# Patient Record
Sex: Male | Born: 1935
Health system: Southern US, Community
[De-identification: ages and names within clinical notes are randomized; demographics above are authoritative.]

## PROBLEM LIST (undated history)

## (undated) DIAGNOSIS — I6529 Occlusion and stenosis of unspecified carotid artery: Secondary | ICD-10-CM

## (undated) DIAGNOSIS — I4430 Unspecified atrioventricular block: Secondary | ICD-10-CM

## (undated) DIAGNOSIS — J439 Emphysema, unspecified: Secondary | ICD-10-CM

## (undated) DIAGNOSIS — L409 Psoriasis, unspecified: Secondary | ICD-10-CM

## (undated) DIAGNOSIS — C4490 Unspecified malignant neoplasm of skin, unspecified: Secondary | ICD-10-CM

## (undated) DIAGNOSIS — E785 Hyperlipidemia, unspecified: Secondary | ICD-10-CM

## (undated) DIAGNOSIS — K635 Polyp of colon: Secondary | ICD-10-CM

## (undated) DIAGNOSIS — I639 Cerebral infarction, unspecified: Secondary | ICD-10-CM

## (undated) DIAGNOSIS — I10 Essential (primary) hypertension: Secondary | ICD-10-CM

## (undated) HISTORY — DX: Emphysema, unspecified: J43.9

## (undated) HISTORY — DX: Psoriasis, unspecified: L40.9

## (undated) HISTORY — DX: Unspecified atrioventricular block: I44.30

## (undated) HISTORY — DX: Unspecified malignant neoplasm of skin, unspecified: C44.90

## (undated) HISTORY — PX: SKIN CANCER EXCISION: SHX779

## (undated) HISTORY — DX: Polyp of colon: K63.5

## (undated) HISTORY — PX: TONSILLECTOMY: SUR1361

## (undated) HISTORY — DX: Hyperlipidemia, unspecified: E78.5

## (undated) HISTORY — PX: FRACTURE SURGERY: SHX138

---

## 2004-08-04 ENCOUNTER — Ambulatory Visit (HOSPITAL_COMMUNITY): Admission: RE | Admit: 2004-08-04 | Discharge: 2004-08-04 | Payer: Self-pay | Admitting: Internal Medicine

## 2004-09-28 ENCOUNTER — Encounter: Admission: RE | Admit: 2004-09-28 | Discharge: 2004-09-28 | Payer: Self-pay | Admitting: Internal Medicine

## 2007-02-13 ENCOUNTER — Ambulatory Visit (HOSPITAL_COMMUNITY): Admission: RE | Admit: 2007-02-13 | Discharge: 2007-02-13 | Payer: Self-pay | Admitting: Internal Medicine

## 2009-02-03 ENCOUNTER — Ambulatory Visit (HOSPITAL_COMMUNITY): Admission: RE | Admit: 2009-02-03 | Discharge: 2009-02-03 | Payer: Self-pay | Admitting: Internal Medicine

## 2009-02-03 ENCOUNTER — Ambulatory Visit: Payer: Self-pay | Admitting: Vascular Surgery

## 2009-02-03 ENCOUNTER — Encounter (INDEPENDENT_AMBULATORY_CARE_PROVIDER_SITE_OTHER): Payer: Self-pay | Admitting: Internal Medicine

## 2009-02-11 ENCOUNTER — Ambulatory Visit (HOSPITAL_COMMUNITY): Admission: RE | Admit: 2009-02-11 | Discharge: 2009-02-11 | Payer: Self-pay | Admitting: Internal Medicine

## 2009-02-11 ENCOUNTER — Encounter (INDEPENDENT_AMBULATORY_CARE_PROVIDER_SITE_OTHER): Payer: Self-pay | Admitting: Internal Medicine

## 2011-07-23 ENCOUNTER — Ambulatory Visit (INDEPENDENT_AMBULATORY_CARE_PROVIDER_SITE_OTHER): Payer: Medicare Other | Admitting: Family Medicine

## 2011-07-23 VITALS — BP 148/68 | HR 61 | Temp 98.2°F | Resp 18 | Ht 71.18 in | Wt 196.4 lb

## 2011-07-23 DIAGNOSIS — S90859A Superficial foreign body, unspecified foot, initial encounter: Secondary | ICD-10-CM

## 2011-07-23 DIAGNOSIS — IMO0002 Reserved for concepts with insufficient information to code with codable children: Secondary | ICD-10-CM

## 2011-07-23 DIAGNOSIS — M79673 Pain in unspecified foot: Secondary | ICD-10-CM

## 2011-07-23 DIAGNOSIS — M79609 Pain in unspecified limb: Secondary | ICD-10-CM

## 2011-07-23 NOTE — Progress Notes (Signed)
VCO.  Local anesthesia with 1 cc 2% lidocaine plain.  SP.  3 mm punch over the entrance wound.  No FB found. Cleansed.  Pressure dressing applied.

## 2011-07-23 NOTE — Progress Notes (Signed)
  Subjective:    Patient ID: Doyce Para, male    DOB: 08-15-1931, 78 y.o.   MRN: [id number]  HPI 78 yo male here with splinter in his foot. Walking barefoot on deck and got splinter in left heel.  Wife tried to get it out.  Got some out but still saw some deeper.     Review of Systems Negative except as per HPI     Objective:   Physical Exam  Constitutional: He appears well-developed.  Pulmonary/Chest: Effort normal.  Neurological: He is alert.  Skin:       Left heel with small open shallow wound with question of dark FB visible versus blood/vessel          Assessment & Plan:  Foot pain Foreign body in foot Removed per PA note.

## 2011-08-02 ENCOUNTER — Other Ambulatory Visit: Payer: Self-pay | Admitting: Internal Medicine

## 2011-08-02 ENCOUNTER — Ambulatory Visit
Admission: RE | Admit: 2011-08-02 | Discharge: 2011-08-02 | Disposition: A | Discharge status: Home / Self Care | Payer: Self-pay | Source: Ambulatory Visit | Attending: Internal Medicine | Admitting: Internal Medicine

## 2011-08-02 DIAGNOSIS — T148XXA Other injury of unspecified body region, initial encounter: Secondary | ICD-10-CM

## 2012-02-07 ENCOUNTER — Other Ambulatory Visit (HOSPITAL_COMMUNITY): Payer: Self-pay | Admitting: Internal Medicine

## 2012-02-07 DIAGNOSIS — R0989 Other specified symptoms and signs involving the circulatory and respiratory systems: Secondary | ICD-10-CM

## 2012-02-14 ENCOUNTER — Ambulatory Visit (HOSPITAL_COMMUNITY)
Admission: RE | Admit: 2012-02-14 | Discharge: 2012-02-14 | Disposition: A | Discharge status: Home / Self Care | Payer: Medicare Other | Source: Ambulatory Visit | Attending: Internal Medicine | Admitting: Internal Medicine

## 2012-02-14 DIAGNOSIS — R0989 Other specified symptoms and signs involving the circulatory and respiratory systems: Secondary | ICD-10-CM | POA: Insufficient documentation

## 2012-02-14 NOTE — Progress Notes (Signed)
Right:  No evidence of significant ICA stenosis.  Vertebral artery flow is retrograde.  Left:  60-79% internal carotid artery stenosis.  Vertebral artery flow is antegrade.

## 2013-02-12 ENCOUNTER — Other Ambulatory Visit (HOSPITAL_COMMUNITY): Payer: Self-pay | Admitting: Internal Medicine

## 2013-02-12 DIAGNOSIS — I6529 Occlusion and stenosis of unspecified carotid artery: Secondary | ICD-10-CM

## 2013-02-17 ENCOUNTER — Other Ambulatory Visit: Payer: Self-pay | Admitting: Internal Medicine

## 2013-02-17 ENCOUNTER — Ambulatory Visit
Admission: RE | Admit: 2013-02-17 | Discharge: 2013-02-17 | Disposition: A | Discharge status: Home / Self Care | Payer: Medicare Other | Source: Ambulatory Visit | Attending: Internal Medicine | Admitting: Internal Medicine

## 2013-02-17 DIAGNOSIS — M25572 Pain in left ankle and joints of left foot: Secondary | ICD-10-CM

## 2013-02-17 DIAGNOSIS — M79671 Pain in right foot: Secondary | ICD-10-CM

## 2013-02-17 DIAGNOSIS — M25571 Pain in right ankle and joints of right foot: Secondary | ICD-10-CM

## 2013-02-17 DIAGNOSIS — M79672 Pain in left foot: Secondary | ICD-10-CM

## 2013-03-20 ENCOUNTER — Ambulatory Visit (HOSPITAL_COMMUNITY)
Admission: RE | Admit: 2013-03-20 | Discharge: 2013-03-20 | Disposition: A | Discharge status: Home / Self Care | Payer: Medicare Other | Source: Ambulatory Visit | Attending: Internal Medicine | Admitting: Internal Medicine

## 2013-03-20 DIAGNOSIS — R0989 Other specified symptoms and signs involving the circulatory and respiratory systems: Secondary | ICD-10-CM

## 2013-03-20 DIAGNOSIS — I6529 Occlusion and stenosis of unspecified carotid artery: Secondary | ICD-10-CM | POA: Insufficient documentation

## 2013-03-20 NOTE — Progress Notes (Signed)
VASCULAR LAB PRELIMINARY  PRELIMINARY  PRELIMINARY  PRELIMINARY  Carotid duplex completed.    Preliminary report: Moderate to severe calcific plaque noted bilaterally.  No significant right ICA stenosis.  Lt ICA stenosis of 60 to 79%  Right vertebral artery flow is retrograde.  Left vertebral artery flow antegrade.    Shyne Lehrke, RVT 03/20/2013, 3:47 PM

## 2014-04-15 ENCOUNTER — Ambulatory Visit (HOSPITAL_COMMUNITY): Payer: Medicare Other

## 2014-04-15 ENCOUNTER — Ambulatory Visit (HOSPITAL_COMMUNITY)
Admission: RE | Admit: 2014-04-15 | Discharge: 2014-04-15 | Disposition: A | Discharge status: Home / Self Care | Payer: Medicare Other | Source: Ambulatory Visit | Attending: Internal Medicine | Admitting: Internal Medicine

## 2014-04-15 ENCOUNTER — Other Ambulatory Visit (HOSPITAL_COMMUNITY): Payer: Self-pay | Admitting: Internal Medicine

## 2014-04-15 DIAGNOSIS — R42 Dizziness and giddiness: Secondary | ICD-10-CM

## 2014-04-15 NOTE — Progress Notes (Signed)
VASCULAR LAB PRELIMINARY  PRELIMINARY  PRELIMINARY  PRELIMINARY  Carotid duplex completed.    Preliminary report:  Leftt - 60% to 79% Upper end of scale. Acoustic shadowing may obscure higher velocities Vertebral artery flow is antegrade. Right - 1% to 39% ICA stenosis upper end of scale vertebral artery flow is retrograde. No significant change since study of 2015.  Roselyne Stalnaker, RVS 04/15/2014, 2:55 PM

## 2019-01-07 ENCOUNTER — Other Ambulatory Visit: Payer: Self-pay

## 2019-01-07 ENCOUNTER — Ambulatory Visit (INDEPENDENT_AMBULATORY_CARE_PROVIDER_SITE_OTHER): Payer: Medicare Other | Admitting: Otolaryngology

## 2019-01-07 ENCOUNTER — Encounter (INDEPENDENT_AMBULATORY_CARE_PROVIDER_SITE_OTHER): Payer: Self-pay | Admitting: Otolaryngology

## 2019-01-07 VITALS — Temp 97.7°F

## 2019-01-07 DIAGNOSIS — H9120 Sudden idiopathic hearing loss, unspecified ear: Secondary | ICD-10-CM

## 2019-01-07 NOTE — Progress Notes (Signed)
HPI: Roger Benjamin is a 85 y.o. male who returns today for evaluation of left ear sudden sensorineural hearing loss.  He completed the steroid Dosepak.  Repeat audiogram today demonstrated no change in his hearing with severe left ear SNHL.  He does not complain of any further balance problems or headaches..  No past medical history on file.  Social History   Socioeconomic History  . Marital status: Married    Spouse name: Not on file  . Number of children: Not on file  . Years of education: Not on file  . Highest education level: Not on file  Occupational History  . Not on file  Social Needs  . Financial resource strain: Not on file  . Food insecurity    Worry: Not on file    Inability: Not on file  . Transportation needs    Medical: Not on file    Non-medical: Not on file  Tobacco Use  . Smoking status: Former Smoker    Packs/day: 3.00    Years: 30.00    Pack years: 90.00    Types: Cigarettes    Start date: 58    Quit date: 02/27/1980    Years since quitting: 38.8  . Smokeless tobacco: Never Used  Substance and Sexual Activity  . Alcohol use: Not on file  . Drug use: Not on file  . Sexual activity: Not on file  Lifestyle  . Physical activity    Days per week: Not on file    Minutes per session: Not on file  . Stress: Not on file  Relationships  . Social Herbalist on phone: Not on file    Gets together: Not on file    Attends religious service: Not on file    Active member of club or organization: Not on file    Attends meetings of clubs or organizations: Not on file    Relationship status: Not on file  Other Topics Concern  . Not on file  Social History Narrative  . Not on file   No family history on file. No Known Allergies Prior to Admission medications   Medication Sig Start Date End Date Taking? Authorizing Provider  metoprolol succinate (TOPROL-XL) 25 MG 24 hr tablet Take 25 mg by mouth daily.    [provider]  Rosuvastatin  Calcium (CRESTOR PO) Take 1 tablet by mouth daily.    [provider]     Positive ROS: Are otherwise negative  All other systems have been reviewed and were otherwise negative with the exception of those mentioned in the HPI and as above.  Physical Exam: General: Alert, no acute distress Ears: Ear canals are clear bilaterally with intact, clear TMs Nasal: Clear nasal passages Oral: Clear oropharynx Neck: No palpable adenopathy or masses  Procedures  Assessment: Left ear sudden sensorineural hearing loss  Plan: Reviewed the present audiogram with the patient.  He has been adapting to only hearing in his right ear. Discussed options of hearing device with audiology versus doing nothing.  He has not really interested in pursuing a hearing device. Discussed with him concerning contacting us if he has any problems with hearing in the right ear.  Did not recommend any further treatment.   Radene Journey, MD

## 2019-01-09 ENCOUNTER — Encounter (INDEPENDENT_AMBULATORY_CARE_PROVIDER_SITE_OTHER): Payer: Self-pay

## 2019-08-02 ENCOUNTER — Emergency Department (HOSPITAL_COMMUNITY): Payer: Medicare Other

## 2019-08-02 ENCOUNTER — Emergency Department (HOSPITAL_COMMUNITY)
Admission: EM | Admit: 2019-08-02 | Discharge: 2019-08-03 | Disposition: A | Discharge status: Home / Self Care | Payer: Medicare Other | Attending: Emergency Medicine | Admitting: Emergency Medicine

## 2019-08-02 ENCOUNTER — Other Ambulatory Visit: Payer: Self-pay

## 2019-08-02 ENCOUNTER — Encounter (HOSPITAL_COMMUNITY): Payer: Self-pay | Admitting: Emergency Medicine

## 2019-08-02 DIAGNOSIS — Y999 Unspecified external cause status: Secondary | ICD-10-CM | POA: Insufficient documentation

## 2019-08-02 DIAGNOSIS — Y92410 Unspecified street and highway as the place of occurrence of the external cause: Secondary | ICD-10-CM | POA: Diagnosis not present

## 2019-08-02 DIAGNOSIS — Z87891 Personal history of nicotine dependence: Secondary | ICD-10-CM | POA: Diagnosis not present

## 2019-08-02 DIAGNOSIS — Y9389 Activity, other specified: Secondary | ICD-10-CM | POA: Insufficient documentation

## 2019-08-02 DIAGNOSIS — S51012A Laceration without foreign body of left elbow, initial encounter: Secondary | ICD-10-CM | POA: Diagnosis not present

## 2019-08-02 DIAGNOSIS — R0781 Pleurodynia: Secondary | ICD-10-CM | POA: Diagnosis not present

## 2019-08-02 DIAGNOSIS — Z79899 Other long term (current) drug therapy: Secondary | ICD-10-CM | POA: Insufficient documentation

## 2019-08-02 DIAGNOSIS — S59902A Unspecified injury of left elbow, initial encounter: Secondary | ICD-10-CM | POA: Diagnosis present

## 2019-08-02 LAB — CBC WITH DIFFERENTIAL/PLATELET
Abs Immature Granulocytes: 0.09 10*3/uL — ABNORMAL HIGH (ref 0.00–0.07)
Basophils Absolute: 0.1 10*3/uL (ref 0.0–0.1)
Basophils Relative: 0 %
Eosinophils Absolute: 0.4 10*3/uL (ref 0.0–0.5)
Eosinophils Relative: 3 %
HCT: 48.6 % (ref 39.0–52.0)
Hemoglobin: 15.9 g/dL (ref 13.0–17.0)
Immature Granulocytes: 1 %
Lymphocytes Relative: 10 %
Lymphs Abs: 1.5 10*3/uL (ref 0.7–4.0)
MCH: 31.5 pg (ref 26.0–34.0)
MCHC: 32.7 g/dL (ref 30.0–36.0)
MCV: 96.2 fL (ref 80.0–100.0)
Monocytes Absolute: 1.6 10*3/uL — ABNORMAL HIGH (ref 0.1–1.0)
Monocytes Relative: 11 %
Neutro Abs: 10.8 10*3/uL — ABNORMAL HIGH (ref 1.7–7.7)
Neutrophils Relative %: 75 %
Platelets: 236 10*3/uL (ref 150–400)
RBC: 5.05 MIL/uL (ref 4.22–5.81)
RDW: 12.5 % (ref 11.5–15.5)
WBC: 14.4 10*3/uL — ABNORMAL HIGH (ref 4.0–10.5)
nRBC: 0 % (ref 0.0–0.2)

## 2019-08-02 LAB — BASIC METABOLIC PANEL
Anion gap: 11 (ref 5–15)
BUN: 25 mg/dL — ABNORMAL HIGH (ref 8–23)
CO2: 26 mmol/L (ref 22–32)
Calcium: 9.6 mg/dL (ref 8.9–10.3)
Chloride: 103 mmol/L (ref 98–111)
Creatinine, Ser: 0.99 mg/dL (ref 0.61–1.24)
GFR calc Af Amer: >60 mL/min (ref >60)
GFR calc non Af Amer: >60 mL/min (ref >60)
Glucose, Bld: 102 mg/dL — ABNORMAL HIGH (ref 70–99)
Potassium: 4.4 mmol/L (ref 3.5–5.1)
Sodium: 140 mmol/L (ref 135–145)
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
## 2019-09-01 ENCOUNTER — Other Ambulatory Visit: Payer: Self-pay

## 2019-09-01 ENCOUNTER — Other Ambulatory Visit (HOSPITAL_COMMUNITY): Payer: Self-pay | Admitting: Internal Medicine

## 2019-09-01 ENCOUNTER — Ambulatory Visit (HOSPITAL_COMMUNITY)
Admission: RE | Admit: 2019-09-01 | Discharge: 2019-09-01 | Disposition: A | Discharge status: Home / Self Care | Payer: Medicare Other | Source: Ambulatory Visit | Attending: Surgery | Admitting: Surgery

## 2019-09-01 DIAGNOSIS — I6522 Occlusion and stenosis of left carotid artery: Secondary | ICD-10-CM | POA: Diagnosis not present

## 2019-09-01 DIAGNOSIS — I440 Atrioventricular block, first degree: Secondary | ICD-10-CM

## 2019-09-07 ENCOUNTER — Other Ambulatory Visit: Payer: Self-pay

## 2019-09-07 ENCOUNTER — Ambulatory Visit (INDEPENDENT_AMBULATORY_CARE_PROVIDER_SITE_OTHER): Payer: Medicare Other | Admitting: Neurology

## 2019-09-07 ENCOUNTER — Encounter: Payer: Self-pay | Admitting: Neurology

## 2019-09-07 VITALS — BP 140/74 | HR 60 | Ht 73.0 in | Wt 185.0 lb

## 2019-09-07 DIAGNOSIS — G4719 Other hypersomnia: Secondary | ICD-10-CM | POA: Diagnosis not present

## 2019-09-07 DIAGNOSIS — R351 Nocturia: Secondary | ICD-10-CM

## 2019-09-07 DIAGNOSIS — G478 Other sleep disorders: Secondary | ICD-10-CM

## 2019-09-07 NOTE — Patient Instructions (Addendum)
Thank you for choosing [facility] for your sleep related care! It was nice to meet you today! I appreciate that you entrust me with your sleep related healthcare concerns. I hope, I was able to address at least some of your concerns today, and that I can help you feel reassured and also get better.    Here is what we discussed today and what we came up with as our plan for you:    Based on your symptoms and your exam I believe you are at risk for obstructive sleep apnea (aka OSA), and I think we should proceed with a sleep study to determine whether you do or do not have OSA and how severe it is. Even, if you have mild OSA, I may want you to consider treatment with CPAP, as treatment of even borderline or mild sleep apnea can result and improvement of symptoms such as sleep disruption, daytime sleepiness, nighttime bathroom breaks, restless leg symptoms, improvement of headache syndromes, even improved mood disorder.   I understand that you prefer to do a home sleep test.  Please remember, the long-term risks and ramifications of untreated moderate to severe obstructive sleep apnea are: increased Cardiovascular disease, including congestive heart failure, stroke, difficult to control hypertension, treatment resistant obesity, arrhythmias, especially irregular heartbeat commonly known as A. Fib. (atrial fibrillation); even type 2 diabetes has been linked to untreated OSA.   Sleep apnea can cause disruption of sleep and sleep deprivation in most cases, which, in turn, can cause recurrent headaches, problems with memory, mood, concentration, focus, and vigilance. Most people with untreated sleep apnea report excessive daytime sleepiness, which can affect their ability to drive. Please do not drive if you feel sleepy. Patients with sleep apnea developed difficulty initiating and maintaining sleep (aka insomnia).   Having sleep apnea may increase your risk for other sleep disorders,  including involuntary behaviors sleep such as sleep terrors, sleep talking, sleepwalking.    Having sleep apnea can also increase your risk for restless leg syndrome and leg movements at night.   Please note that untreated obstructive sleep apnea may carry additional perioperative morbidity. Patients with significant obstructive sleep apnea (typically, in the moderate to severe degree) should receive, if possible, perioperative PAP (positive airway pressure) therapy and the surgeons and particularly the anesthesiologists should be informed of the diagnosis and the severity of the sleep disordered breathing.   I will likely see you back after your sleep study to go over the test results and where to go from there. We will call you after your sleep study to advise about the results (most likely, you will hear from [place], my nurse) and to set up an appointment at the time, as necessary.    Our sleep lab administrative assistant will call you to schedule your sleep study and give you further instructions, regarding the check in process for the sleep study, arrival time, what to bring, when you can expect to leave after the study, etc., and to answer any other logistical questions you may have. If you don't hear back from her by about 2 weeks from now, please feel free to call her direct line at [phone] or you can call our general clinic number, or email [place] through My Chart.
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
## 2019-09-08 ENCOUNTER — Encounter: Payer: Self-pay | Admitting: Vascular Surgery

## 2019-09-08 ENCOUNTER — Other Ambulatory Visit: Payer: Self-pay

## 2019-09-08 ENCOUNTER — Ambulatory Visit (INDEPENDENT_AMBULATORY_CARE_PROVIDER_SITE_OTHER): Payer: Medicare Other | Admitting: Vascular Surgery

## 2019-09-08 VITALS — BP 149/84 | HR 59 | Temp 98.3°F | Resp 20 | Ht 73.0 in | Wt 186.4 lb

## 2019-09-08 DIAGNOSIS — I6522 Occlusion and stenosis of left carotid artery: Secondary | ICD-10-CM

## 2019-09-08 DIAGNOSIS — I6529 Occlusion and stenosis of unspecified carotid artery: Secondary | ICD-10-CM

## 2019-09-08 NOTE — Progress Notes (Signed)
[facility] [place]  Patient name: Roger Benjamin MRN: [id number] DOB: Mar 02, 1931 Sex: male  REASON FOR CONSULT: Evaluate left carotid stenosis  HPI: Roger Benjamin is a 86 y.o. male, who is here today for evaluation of left carotid stenosis.  He reports that he has had a known history of asymptomatic carotid disease does been followed for as much as 25 years.  He recently was undergoing resurveillance for excessive somnolence.  Part of his work-up included carotid duplex evaluation.  He is seen today for further discussion of this.  He is quite active at his age of 58.  He still works 5 days a week at his [employer].  He specifically denies any prior history of amaurosis fugax, aphasia, transient ischemic attack or stroke.  Past Medical History:  Diagnosis Date  . CAD (coronary artery disease)   . Skin cancer     History reviewed. No pertinent family history.  SOCIAL HISTORY: Social History   Socioeconomic History  . Marital status: Married    Spouse name: Not on file  . Number of children: Not on file  . Years of education: Not on file  . Highest education level: Not on file  Occupational History  . Not on file  Tobacco Use  . Smoking status: Former Smoker    Packs/day: 3.00    Years: 30.00    Pack years: 90.00    Types: Cigarettes    Start date: 83    Quit date: 02/27/1980    Years since quitting: 39.5  . Smokeless tobacco: Never Used  Vaping Use  . Vaping Use: Never used  Substance and Sexual Activity  . Alcohol use: Yes    Comment: 1 drink dail   . Drug use: Never  . Sexual activity: Not on file  Other Topics Concern  . Not on file  Social History Narrative  . Not on file   Social Determinants of Health   Financial Resource Strain:   . Difficulty of Paying Living Expenses:   Food Insecurity:   . Worried About Charity fundraiser in the Last Year:   . Arboriculturist in the Last Year:    Transportation Needs:   . Film/video editor (Medical):   Marland Kitchen Lack of Transportation (Non-Medical):   Physical Activity:   . Days of Exercise per Week:   . Minutes of Exercise per Session:   Stress:   . Feeling of Stress :   Social Connections:   . Frequency of Communication with Friends and Family:   . Frequency of Social Gatherings with Friends and Family:   . Attends Religious Services:   . Active Member of Clubs or Organizations:   . Attends Archivist Meetings:   Marland Kitchen Marital Status:   Intimate Partner Violence:   . Fear of Current or Ex-Partner:   . Emotionally Abused:   Marland Kitchen Physically Abused:   . Sexually Abused:     No Known Allergies  Current Outpatient Medications  Medication Sig Dispense Refill  . metoprolol succinate (TOPROL-XL) 25 MG 24 hr tablet Take 25 mg by mouth daily.     No current facility-administered medications for this visit.    REVIEW OF SYSTEMS:  [X]  denotes positive finding, [ ]  denotes negative finding Cardiac  Comments:  Chest pain or chest pressure:    Shortness of breath upon exertion:    Short of breath when lying flat:    Irregular heart rhythm:  Vascular    Pain in calf, thigh, or hip brought on by ambulation:    Pain in feet at night that wakes you up from your sleep:     Blood clot in your veins:    Leg swelling:         Pulmonary    Oxygen at home:    Productive cough:     Wheezing:         Neurologic    Sudden weakness in arms or legs:     Sudden numbness in arms or legs:     Sudden onset of difficulty speaking or slurred speech:    Temporary loss of vision in one eye:     Problems with dizziness:         Gastrointestinal    Blood in stool:     Vomited blood:         Genitourinary    Burning when urinating:     Blood in urine:        Psychiatric    Major depression:         Hematologic    Bleeding problems:    Problems with blood clotting too easily:        Skin    Rashes or ulcers: x         Constitutional    Fever or chills:      PHYSICAL EXAM: Vitals:   09/08/19 0818 09/08/19 0820  BP: (!) 161/74 (!) 149/84  Pulse: (!) 59   Resp: 20   Temp: 98.3 F (36.8 C)   SpO2: 95%   Weight: 186 lb 6.4 oz (84.6 kg)   Height: 6\' 1"  (1.854 m)     GENERAL: The patient is a well-nourished male, in no acute distress. The vital signs are documented above. CARDIOVASCULAR: Carotid arteries without bruits bilaterally.  2+ radial 2+ femoral and 2+ dorsalis pedis pulses bilaterally PULMONARY: There is good air exchange  ABDOMEN: Soft and non-tender  MUSCULOSKELETAL: There are no major deformities or cyanosis. NEUROLOGIC: No focal weakness or paresthesias are detected. SKIN: There are no ulcers or rashes noted. PSYCHIATRIC: The patient has a normal affect.  DATA:  I reviewed his carotid duplex from 09/01/2019.  This showed no evidence of stenosis in his right carotid system.  On the left there is extensive calcified plaque.  His carotid bifurcation could not be adequately evaluated due to this dense plaque.  Flow velocities in the common carotid artery plaque proximal to the bifurcation I suggest more significant distal disease.  MEDICAL ISSUES: Had long discussion with patient regarding this.  Explained that he clearly is asymptomatic from his standpoint of his carotid disease.  He had may have progression to severe stenosis.  I have recommended CT angiogram for further evaluation.  I did explain that if he does have a critical stenosis of his left carotid bifurcation and internal carotid artery I would recommend endarterectomy for reduction of stroke risk.  I explained the procedure including the potential 1 to 2% risk of stroke with surgery.  We will obtain a CT angiogram and I will discuss this with the patient following this by telephone.  He is continuing his evaluation for sleep issues and understands that this is not related to his carotid disease   Rosetta Posner, MD [facility]  [facility] of [facility] Tel [phone] Pager [phone]

## 2019-09-08 NOTE — H&P (View-Only) (Signed)
[facility] of [place]  Patient name: Roger Benjamin MRN: [id number] DOB: 1931/12/05 Sex: male  REASON FOR CONSULT: Evaluate left carotid stenosis  HPI: Roger Benjamin is a 86 y.o. male, who is here today for evaluation of left carotid stenosis.  He reports that he has had a known history of asymptomatic carotid disease does been followed for as much as 25 years.  He recently was undergoing resurveillance for excessive somnolence.  Part of his work-up included carotid duplex evaluation.  He is seen today for further discussion of this.  He is quite active at his age of 40.  He still works 5 days a week at his [employer].  He specifically denies any prior history of amaurosis fugax, aphasia, transient ischemic attack or stroke.  Past Medical History:  Diagnosis Date  . CAD (coronary artery disease)   . Skin cancer     History reviewed. No pertinent family history.  SOCIAL HISTORY: Social History   Socioeconomic History  . Marital status: Married    Spouse name: Not on file  . Number of children: Not on file  . Years of education: Not on file  . Highest education level: Not on file  Occupational History  . Not on file  Tobacco Use  . Smoking status: Former Smoker    Packs/day: 3.00    Years: 30.00    Pack years: 90.00    Types: Cigarettes    Start date: 78    Quit date: 02/27/1980    Years since quitting: 39.5  . Smokeless tobacco: Never Used  Vaping Use  . Vaping Use: Never used  Substance and Sexual Activity  . Alcohol use: Yes    Comment: 1 drink dail   . Drug use: Never  . Sexual activity: Not on file  Other Topics Concern  . Not on file  Social History Narrative  . Not on file   Social Determinants of Health   Financial Resource Strain:   . Difficulty of Paying Living Expenses:   Food Insecurity:   . Worried About Charity fundraiser in the Last Year:   . Arboriculturist in the Last Year:    Transportation Needs:   . Film/video editor (Medical):   Marland Kitchen Lack of Transportation (Non-Medical):   Physical Activity:   . Days of Exercise per Week:   . Minutes of Exercise per Session:   Stress:   . Feeling of Stress :   Social Connections:   . Frequency of Communication with Friends and Family:   . Frequency of Social Gatherings with Friends and Family:   . Attends Religious Services:   . Active Member of Clubs or Organizations:   . Attends Archivist Meetings:   Marland Kitchen Marital Status:   Intimate Partner Violence:   . Fear of Current or Ex-Partner:   . Emotionally Abused:   Marland Kitchen Physically Abused:   . Sexually Abused:     No Known Allergies  Current Outpatient Medications  Medication Sig Dispense Refill  . metoprolol succinate (TOPROL-XL) 25 MG 24 hr tablet Take 25 mg by mouth daily.     No current facility-administered medications for this visit.    REVIEW OF SYSTEMS:  [X]  denotes positive finding, [ ]  denotes negative finding Cardiac  Comments:  Chest pain or chest pressure:    Shortness of breath upon exertion:    Short of breath when lying flat:    Irregular heart rhythm:  Vascular    Pain in calf, thigh, or hip brought on by ambulation:    Pain in feet at night that wakes you up from your sleep:     Blood clot in your veins:    Leg swelling:         Pulmonary    Oxygen at home:    Productive cough:     Wheezing:         Neurologic    Sudden weakness in arms or legs:     Sudden numbness in arms or legs:     Sudden onset of difficulty speaking or slurred speech:    Temporary loss of vision in one eye:     Problems with dizziness:         Gastrointestinal    Blood in stool:     Vomited blood:         Genitourinary    Burning when urinating:     Blood in urine:        Psychiatric    Major depression:         Hematologic    Bleeding problems:    Problems with blood clotting too easily:        Skin    Rashes or ulcers: x         Constitutional    Fever or chills:      PHYSICAL EXAM: Vitals:   09/08/19 0818 09/08/19 0820  BP: (!) 161/74 (!) 149/84  Pulse: (!) 59   Resp: 20   Temp: 98.3 F (36.8 C)   SpO2: 95%   Weight: 186 lb 6.4 oz (84.6 kg)   Height: 6\' 1"  (1.854 m)     GENERAL: The patient is a well-nourished male, in no acute distress. The vital signs are documented above. CARDIOVASCULAR: Carotid arteries without bruits bilaterally.  2+ radial 2+ femoral and 2+ dorsalis pedis pulses bilaterally PULMONARY: There is good air exchange  ABDOMEN: Soft and non-tender  MUSCULOSKELETAL: There are no major deformities or cyanosis. NEUROLOGIC: No focal weakness or paresthesias are detected. SKIN: There are no ulcers or rashes noted. PSYCHIATRIC: The patient has a normal affect.  DATA:  I reviewed his carotid duplex from 09/01/2019.  This showed no evidence of stenosis in his right carotid system.  On the left there is extensive calcified plaque.  His carotid bifurcation could not be adequately evaluated due to this dense plaque.  Flow velocities in the common carotid artery plaque proximal to the bifurcation I suggest more significant distal disease.  MEDICAL ISSUES: Had long discussion with patient regarding this.  Explained that he clearly is asymptomatic from his standpoint of his carotid disease.  He had may have progression to severe stenosis.  I have recommended CT angiogram for further evaluation.  I did explain that if he does have a critical stenosis of his left carotid bifurcation and internal carotid artery I would recommend endarterectomy for reduction of stroke risk.  I explained the procedure including the potential 1 to 2% risk of stroke with surgery.  We will obtain a CT angiogram and I will discuss this with the patient following this by telephone.  He is continuing his evaluation for sleep issues and understands that this is not related to his carotid disease   Rosetta Posner, MD [facility] Vascular  and Vein Specialists of [facility] Tel [phone] Pager [phone]

## 2019-09-14 ENCOUNTER — Ambulatory Visit (HOSPITAL_COMMUNITY)
Admission: RE | Admit: 2019-09-14 | Discharge: 2019-09-14 | Disposition: A | Discharge status: Home / Self Care | Payer: Medicare Other | Source: Ambulatory Visit | Attending: Vascular Surgery | Admitting: Vascular Surgery

## 2019-09-14 ENCOUNTER — Other Ambulatory Visit: Payer: Self-pay

## 2019-09-14 DIAGNOSIS — I6529 Occlusion and stenosis of unspecified carotid artery: Secondary | ICD-10-CM | POA: Diagnosis present

## 2019-09-14 LAB — POCT I-STAT CREATININE: Creatinine, Ser: 0.7 mg/dL (ref 0.61–1.24)

## 2019-09-14 MED ORDER — IOHEXOL 350 MG/ML SOLN
50.0000 mL | Freq: Once | INTRAVENOUS | Status: AC | PRN
  Administered 2019-09-14: 50 mL via INTRAVENOUS

## 2019-09-18 ENCOUNTER — Other Ambulatory Visit: Payer: Self-pay

## 2019-09-21 ENCOUNTER — Telehealth: Payer: Self-pay

## 2019-09-21 ENCOUNTER — Encounter (HOSPITAL_COMMUNITY): Payer: Self-pay | Admitting: Vascular Surgery

## 2019-09-21 NOTE — Progress Notes (Signed)
Patient denies chest pain, shob, or cardiology visit. PCP Dr. Francesco Sor. Educated on need to quarantine post COVID Research officer, political party. Educated on Environmental manager.

## 2019-09-21 NOTE — Telephone Encounter (Signed)
LVM for pt to call me back to schedule sleep study  

## 2019-09-22 ENCOUNTER — Encounter (HOSPITAL_COMMUNITY): Payer: Self-pay | Admitting: Vascular Surgery

## 2019-09-22 ENCOUNTER — Other Ambulatory Visit (HOSPITAL_COMMUNITY)
Admission: RE | Admit: 2019-09-22 | Discharge: 2019-09-22 | Disposition: A | Discharge status: Home / Self Care | Payer: Medicare Other | Source: Ambulatory Visit | Attending: Vascular Surgery | Admitting: Vascular Surgery

## 2019-09-22 LAB — SARS CORONAVIRUS 2 (TAT 6-24 HRS): SARS Coronavirus 2: NEGATIVE

## 2019-09-22 NOTE — Progress Notes (Signed)
Anesthesia Chart Review: SAME DAY WORK-UP    Case: [id number] Date/Time: 09/23/19 0954   Procedure: ENDARTERECTOMY CAROTID (Left )   Anesthesia type: General   Pre-op diagnosis: LEFT CAROTID ARTERY STENOSIS   Location: [facility] OR ROOM 27 / [place] [facility]   Surgeons: Rosetta Posner, MD      DISCUSSION: Patient is an 86 year old male scheduled for the above procedure. He reported that he has had known carotid artery disease for > 10 years, but had been dozing more frequently so imaging updated and showed progression of his LICA stenosis. He is also seeing neurology about arranging a home sleep study.   History includes former smoker, hypertension, carotid artery stenosis, skin cancer excision.   I called and spoke with Mr. Daywalt. He denied known heart disease, including CAD and irregular rhythm. He denied chest pain and SOB. No edema. Denied dizziness or syncope. 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   He is a same day work-up, so further evaluation on the day of surgery. 09/22/19 presurgical COVID-19 test is in process.   VS:   BP Readings from Last 3 Encounters:  09/08/19 (!) 149/84  09/07/19 140/74  08/02/19 (!) 142/75   Pulse Readings from Last 3 Encounters:  09/08/19 (!) 59  09/07/19 60  08/02/19 79    PROVIDERS: Sueanne Margarita, DO is PCP ([facility]) Star Age, MD is neurologist   LABS: He is for labs on the day of surgery. As of 08/02/19, Cr 0.99, glucose 102, WBC 14.4, H/H 15.9/48.6, PLT 236.   IMAGES: CTA Neck 09/14/19: IMPRESSION: 1. 25% diameter stenosis proximal right internal carotid artery due to calcific plaque 2. Heavily calcified and extensive plaque in the proximal left internal carotid artery with high-grade stenosis estimated 90% diameter stenosis. Left internal carotid artery is patent with small caliber and decreased flow in the cervical  internal carotid artery above the stenosis. 3. Left vertebral artery widely patent 4. Segmental occlusion mid right vertebral artery with faint opacification distally. 5. Aortic Atherosclerosis (ICD10-I70.0) and Emphysema (ICD10-J43.9).  CXR 08/02/19: FINDINGS: Frontal and lateral views of the chest demonstrate an unremarkable cardiac silhouette. Diffuse interstitial prominence throughout the lungs likely chronic given known history of tobacco abuse. No airspace disease, effusion, or pneumothorax. No acute bony abnormalities. IMPRESSION: 1. Likely chronic interstitial lung disease.  No acute process.   EKG: 08/02/19: Sinus or ectopic atrial rhythm Prolonged PR interval Right ventricular hypertrophy Inferior infarct, old Confirmed by Randal Buba, April ([id number]) on 08/03/2019 8:47:53 AM - LAD also noted. No comparison EKG seen in [facility] or [facility].   CV: Carotid US 09/01/19: Summary:  - Right Carotid: Velocities in the right ICA are consistent with a 1-39% stenosis.  - Left Carotid: Significant calcified plaque in the proximal and mid internal carotid artery with minimal visualization of flow in these segments. Flow is obtained in the distal segment. Unable to categorize level of disease due to lack of data. Abnormal common carotid artery waveforms indicative of distal disease.  - Vertebrals: Left vertebral artery demonstrates antegrade flow. Right vertebral artery demonstrates retrograde flow.  - Subclavians: Normal flow hemodynamics were seen in bilateral subclavian arteries.    Past Medical History:  Diagnosis Date  . Carotid artery stenosis   . Hypertension    Unsure is on Metoprolol  . Skin cancer     Past Surgical History:  Procedure Laterality  Date  . FRACTURE SURGERY Left    leg  . SKIN CANCER EXCISION    . TONSILLECTOMY     as a child    MEDICATIONS: No current facility-administered medications for this encounter.   Marland Kitchen ascorbic acid (VITAMIN C) 500 MG tablet  . aspirin  EC 81 MG tablet  . metoprolol succinate (TOPROL-XL) 25 MG 24 hr tablet  . Multiple Vitamins-Minerals (MULTIVITAMIN WITH MINERALS) tablet    Myra Gianotti, PA-C Surgical Short Stay/Anesthesiology [facility] Phone [phone] [facility] Phone [phone] 09/22/2019 5:12 PM

## 2019-09-22 NOTE — Anesthesia Preprocedure Evaluation (Addendum)
Anesthesia Evaluation  Patient identified by MRN, date of birth, ID band Patient awake    Reviewed: Allergy & Precautions, NPO status , Patient's Chart, lab work & pertinent test results, reviewed documented beta blocker date and time   Airway Mallampati: I  TM Distance: >3 FB Neck ROM: Full    Dental  (+) Dental Advisory Given, Teeth Intact   Pulmonary neg shortness of breath, neg sleep apnea, neg COPD, neg recent URI, former smoker,    breath sounds clear to auscultation       Cardiovascular hypertension, Pt. on medications and Pt. on home beta blockers (-) CHF (-) dysrhythmias  Rhythm:Regular     Neuro/Psych negative neurological ROS  negative psych ROS   GI/Hepatic negative GI ROS, Neg liver ROS,   Endo/Other  negative endocrine ROS  Renal/GU negative Renal ROS     Musculoskeletal negative musculoskeletal ROS (+)   Abdominal   Peds  Hematology negative hematology ROS (+)   Anesthesia Other Findings   Reproductive/Obstetrics                             Anesthesia Physical Anesthesia Plan  ASA: II  Anesthesia Plan: General   Post-op Pain Management:    Induction: Intravenous  PONV Risk Score and Plan: 2 and Ondansetron and Dexamethasone  Airway Management Planned: Oral ETT  Additional Equipment: Arterial line  Intra-op Plan:   Post-operative Plan: Extubation in OR  Informed Consent: I have reviewed the patients History and Physical, chart, labs and discussed the procedure including the risks, benefits and alternatives for the proposed anesthesia with the patient or authorized representative who has indicated his/her understanding and acceptance.     Dental advisory given  Plan Discussed with: CRNA and Surgeon  Anesthesia Plan Comments: (PAT note written 09/22/2019 by Myra Gianotti, PA-C. SAME DAY WORK-UP   )       Anesthesia Quick Evaluation

## 2019-09-23 ENCOUNTER — Encounter (HOSPITAL_COMMUNITY)
Admission: RE | Disposition: A | Discharge status: Home / Self Care | Payer: Self-pay | Source: Home / Self Care | Attending: Vascular Surgery

## 2019-09-23 ENCOUNTER — Inpatient Hospital Stay (HOSPITAL_COMMUNITY): Payer: Medicare Other | Admitting: Vascular Surgery

## 2019-09-23 ENCOUNTER — Other Ambulatory Visit: Payer: Self-pay

## 2019-09-23 ENCOUNTER — Inpatient Hospital Stay (HOSPITAL_COMMUNITY)
Admission: RE | Admit: 2019-09-23 | Discharge: 2019-09-24 | DRG: 039 | Disposition: A | Discharge status: Home / Self Care | Payer: Medicare Other | Attending: Vascular Surgery | Admitting: Vascular Surgery

## 2019-09-23 ENCOUNTER — Encounter (HOSPITAL_COMMUNITY): Payer: Self-pay | Admitting: Vascular Surgery

## 2019-09-23 DIAGNOSIS — I6522 Occlusion and stenosis of left carotid artery: Secondary | ICD-10-CM | POA: Diagnosis not present

## 2019-09-23 DIAGNOSIS — I10 Essential (primary) hypertension: Secondary | ICD-10-CM | POA: Diagnosis not present

## 2019-09-23 DIAGNOSIS — Z79899 Other long term (current) drug therapy: Secondary | ICD-10-CM | POA: Diagnosis not present

## 2019-09-23 DIAGNOSIS — Z20822 Contact with and (suspected) exposure to covid-19: Secondary | ICD-10-CM | POA: Diagnosis not present

## 2019-09-23 DIAGNOSIS — Z91048 Other nonmedicinal substance allergy status: Secondary | ICD-10-CM

## 2019-09-23 DIAGNOSIS — R400 Somnolence: Secondary | ICD-10-CM | POA: Diagnosis present

## 2019-09-23 DIAGNOSIS — R600 Localized edema: Secondary | ICD-10-CM | POA: Diagnosis not present

## 2019-09-23 DIAGNOSIS — I2510 Atherosclerotic heart disease of native coronary artery without angina pectoris: Secondary | ICD-10-CM | POA: Diagnosis present

## 2019-09-23 DIAGNOSIS — Z87891 Personal history of nicotine dependence: Secondary | ICD-10-CM

## 2019-09-23 DIAGNOSIS — Z85828 Personal history of other malignant neoplasm of skin: Secondary | ICD-10-CM

## 2019-09-23 HISTORY — DX: Occlusion and stenosis of unspecified carotid artery: I65.29

## 2019-09-23 HISTORY — PX: ENDARTERECTOMY: SHX5162

## 2019-09-23 HISTORY — DX: Essential (primary) hypertension: I10

## 2019-09-23 LAB — CBC
HCT: 47 % (ref 39.0–52.0)
Hemoglobin: 15.3 g/dL (ref 13.0–17.0)
MCH: 31.2 pg (ref 26.0–34.0)
MCHC: 32.6 g/dL (ref 30.0–36.0)
MCV: 95.7 fL (ref 80.0–100.0)
Platelets: 259 10*3/uL (ref 150–400)
RBC: 4.91 MIL/uL (ref 4.22–5.81)
RDW: 12.4 % (ref 11.5–15.5)
WBC: 11.8 10*3/uL — ABNORMAL HIGH (ref 4.0–10.5)
nRBC: 0 % (ref 0.0–0.2)

## 2019-09-23 LAB — COMPREHENSIVE METABOLIC PANEL
ALT: 21 U/L (ref 0–44)
AST: 22 U/L (ref 15–41)
Albumin: 3.9 g/dL (ref 3.5–5.0)
Alkaline Phosphatase: 39 U/L (ref 38–126)
Anion gap: 8 (ref 5–15)
BUN: 16 mg/dL (ref 8–23)
CO2: 25 mmol/L (ref 22–32)
Calcium: 9.2 mg/dL (ref 8.9–10.3)
Chloride: 104 mmol/L (ref 98–111)
Creatinine, Ser: 0.87 mg/dL (ref 0.61–1.24)
GFR calc Af Amer: >60 mL/min (ref >60)
GFR calc non Af Amer: >60 mL/min (ref >60)
Glucose, Bld: 92 mg/dL (ref 70–99)
Potassium: 4.4 mmol/L (ref 3.5–5.1)
Sodium: 137 mmol/L (ref 135–145)
Total Bilirubin: 0.8 mg/dL (ref 0.3–1.2)
Total Protein: 6.6 g/dL (ref 6.5–8.1)

## 2019-09-23 LAB — SURGICAL PCR SCREEN
MRSA, PCR: NEGATIVE
Staphylococcus aureus: NEGATIVE

## 2019-09-23 LAB — APTT: aPTT: 31 seconds (ref 24–36)

## 2019-09-23 LAB — URINALYSIS, ROUTINE W REFLEX MICROSCOPIC
Bilirubin Urine: NEGATIVE
Glucose, UA: NEGATIVE mg/dL
Hgb urine dipstick: NEGATIVE
Ketones, ur: NEGATIVE mg/dL
Leukocytes,Ua: NEGATIVE
Nitrite: NEGATIVE
Protein, ur: NEGATIVE mg/dL
Specific Gravity, Urine: 1.018 (ref 1.005–1.030)
pH: 6 (ref 5.0–8.0)

## 2019-09-23 LAB — TYPE AND SCREEN
ABO/RH(D): A POS
Antibody Screen: NEGATIVE

## 2019-09-23 LAB — ABO/RH: ABO/RH(D): A POS

## 2019-09-23 LAB — PROTIME-INR
INR: 1.1 (ref 0.8–1.2)
Prothrombin Time: 14.2 seconds (ref 11.4–15.2)

## 2019-09-23 LAB — GLUCOSE, CAPILLARY: Glucose-Capillary: 149 mg/dL — ABNORMAL HIGH (ref 70–99)

## 2019-09-23 SURGERY — ENDARTERECTOMY, CAROTID
Anesthesia: General | Site: Neck | Laterality: Left

## 2019-09-23 MED ORDER — LABETALOL HCL 5 MG/ML IV SOLN: 10.0000 mg | INTRAVENOUS | Status: DC | PRN

## 2019-09-23 MED ORDER — 0.9 % SODIUM CHLORIDE (POUR BTL) OPTIME
TOPICAL | Status: DC | PRN
  Administered 2019-09-23: 2000 mL

## 2019-09-23 MED ORDER — CHLORHEXIDINE GLUCONATE 0.12 % MT SOLN
15.0000 mL | Freq: Once | OROMUCOSAL | Status: AC
  Administered 2019-09-23: 15 mL via OROMUCOSAL
  Filled 2019-09-23: qty 15

## 2019-09-23 MED ORDER — ASPIRIN EC 81 MG PO TBEC
81.0000 mg | DELAYED_RELEASE_TABLET | ORAL | Status: DC
  Administered 2019-09-24: 81 mg via ORAL
  Filled 2019-09-23: qty 1

## 2019-09-23 MED ORDER — SUGAMMADEX SODIUM 200 MG/2ML IV SOLN
INTRAVENOUS | Status: DC | PRN
  Administered 2019-09-23: 150 mg via INTRAVENOUS

## 2019-09-23 MED ORDER — METOPROLOL SUCCINATE ER 25 MG PO TB24
25.0000 mg | ORAL_TABLET | Freq: Every day | ORAL | Status: DC
  Administered 2019-09-24: 25 mg via ORAL
  Filled 2019-09-23: qty 1

## 2019-09-23 MED ORDER — ACETAMINOPHEN 160 MG/5ML PO SOLN: 1000.0000 mg | Freq: Once | ORAL | Status: DC | PRN

## 2019-09-23 MED ORDER — MAGNESIUM SULFATE 2 GM/50ML IV SOLN: 2.0000 g | Freq: Every day | INTRAVENOUS | Status: DC | PRN

## 2019-09-23 MED ORDER — PANTOPRAZOLE SODIUM 40 MG PO TBEC
40.0000 mg | DELAYED_RELEASE_TABLET | Freq: Every day | ORAL | Status: DC
  Administered 2019-09-23 – 2019-09-24 (×2): 40 mg via ORAL
  Filled 2019-09-23 (×2): qty 1

## 2019-09-23 MED ORDER — BISACODYL 10 MG RE SUPP: 10.0000 mg | Freq: Every day | RECTAL | Status: DC | PRN

## 2019-09-23 MED ORDER — SODIUM CHLORIDE 0.9 % IV SOLN: INTRAVENOUS | Status: DC

## 2019-09-23 MED ORDER — ROCURONIUM BROMIDE 10 MG/ML (PF) SYRINGE
PREFILLED_SYRINGE | INTRAVENOUS | Status: DC | PRN
  Administered 2019-09-23: 80 mg via INTRAVENOUS

## 2019-09-23 MED ORDER — DOCUSATE SODIUM 100 MG PO CAPS
100.0000 mg | ORAL_CAPSULE | Freq: Every day | ORAL | Status: DC
  Administered 2019-09-24: 100 mg via ORAL
  Filled 2019-09-23: qty 1

## 2019-09-23 MED ORDER — PROPOFOL 10 MG/ML IV BOLUS
INTRAVENOUS | Status: AC
  Filled 2019-09-23: qty 20

## 2019-09-23 MED ORDER — HEPARIN SODIUM (PORCINE) 1000 UNIT/ML IJ SOLN
INTRAMUSCULAR | Status: AC
  Filled 2019-09-23: qty 2

## 2019-09-23 MED ORDER — ACETAMINOPHEN 500 MG PO TABS: 1000.0000 mg | ORAL_TABLET | Freq: Once | ORAL | Status: DC | PRN

## 2019-09-23 MED ORDER — OXYCODONE HCL 5 MG PO TABS: 5.0000 mg | ORAL_TABLET | Freq: Once | ORAL | Status: DC | PRN

## 2019-09-23 MED ORDER — LIDOCAINE HCL (PF) 1 % IJ SOLN
INTRAMUSCULAR | Status: AC
  Filled 2019-09-23: qty 5

## 2019-09-23 MED ORDER — CHLORHEXIDINE GLUCONATE CLOTH 2 % EX PADS: 6.0000 | MEDICATED_PAD | Freq: Once | CUTANEOUS | Status: DC

## 2019-09-23 MED ORDER — ASCORBIC ACID 500 MG PO TABS
500.0000 mg | ORAL_TABLET | Freq: Every day | ORAL | Status: DC
  Administered 2019-09-23 – 2019-09-24 (×2): 500 mg via ORAL
  Filled 2019-09-23 (×2): qty 1

## 2019-09-23 MED ORDER — HEPARIN SODIUM (PORCINE) 1000 UNIT/ML IJ SOLN
INTRAMUSCULAR | Status: DC | PRN
  Administered 2019-09-23: 8000 [IU] via INTRAVENOUS

## 2019-09-23 MED ORDER — SODIUM CHLORIDE 0.9 % IV SOLN: 500.0000 mL | Freq: Once | INTRAVENOUS | Status: DC | PRN

## 2019-09-23 MED ORDER — LACTATED RINGERS IV SOLN
INTRAVENOUS | Status: DC | PRN
Start: 2019-09-23 — End: 2019-09-23

## 2019-09-23 MED ORDER — DEXAMETHASONE SODIUM PHOSPHATE 10 MG/ML IJ SOLN
INTRAMUSCULAR | Status: AC
  Filled 2019-09-23: qty 1

## 2019-09-23 MED ORDER — LIDOCAINE 2% (20 MG/ML) 5 ML SYRINGE
INTRAMUSCULAR | Status: AC
  Filled 2019-09-23: qty 5

## 2019-09-23 MED ORDER — OXYCODONE HCL 5 MG/5ML PO SOLN: 5.0000 mg | Freq: Once | ORAL | Status: DC | PRN

## 2019-09-23 MED ORDER — SODIUM CHLORIDE 0.9 % IV SOLN
INTRAVENOUS | Status: DC
  Administered 2019-09-23: 10 mL/h via INTRAVENOUS

## 2019-09-23 MED ORDER — OXYCODONE-ACETAMINOPHEN 5-325 MG PO TABS: 1.0000 | ORAL_TABLET | ORAL | Status: DC | PRN

## 2019-09-23 MED ORDER — POTASSIUM CHLORIDE CRYS ER 20 MEQ PO TBCR: 20.0000 meq | EXTENDED_RELEASE_TABLET | Freq: Every day | ORAL | Status: DC | PRN

## 2019-09-23 MED ORDER — ACETAMINOPHEN 10 MG/ML IV SOLN
INTRAVENOUS | Status: AC
  Filled 2019-09-23: qty 100

## 2019-09-23 MED ORDER — ORAL CARE MOUTH RINSE: 15.0000 mL | Freq: Once | OROMUCOSAL | Status: AC

## 2019-09-23 MED ORDER — FENTANYL CITRATE (PF) 250 MCG/5ML IJ SOLN
INTRAMUSCULAR | Status: AC
  Filled 2019-09-23: qty 5

## 2019-09-23 MED ORDER — FENTANYL CITRATE (PF) 250 MCG/5ML IJ SOLN
INTRAMUSCULAR | Status: DC | PRN
  Administered 2019-09-23 (×2): 100 ug via INTRAVENOUS
  Administered 2019-09-23: 50 ug via INTRAVENOUS

## 2019-09-23 MED ORDER — CEFAZOLIN SODIUM-DEXTROSE 2-4 GM/100ML-% IV SOLN
2.0000 g | Freq: Three times a day (TID) | INTRAVENOUS | Status: AC
  Administered 2019-09-23 – 2019-09-24 (×2): 2 g via INTRAVENOUS
  Filled 2019-09-23 (×2): qty 100

## 2019-09-23 MED ORDER — FENTANYL CITRATE (PF) 100 MCG/2ML IJ SOLN
INTRAMUSCULAR | Status: AC
  Filled 2019-09-23: qty 2

## 2019-09-23 MED ORDER — PHENYLEPHRINE HCL-NACL 10-0.9 MG/250ML-% IV SOLN
INTRAVENOUS | Status: DC | PRN
  Administered 2019-09-23: 25 ug/min via INTRAVENOUS

## 2019-09-23 MED ORDER — PROTAMINE SULFATE 10 MG/ML IV SOLN
INTRAVENOUS | Status: DC | PRN
Start: 2019-09-23 — End: 2019-09-23
  Administered 2019-09-23 (×2): 50 mg via INTRAVENOUS

## 2019-09-23 MED ORDER — ONDANSETRON HCL 4 MG/2ML IJ SOLN: 4.0000 mg | Freq: Four times a day (QID) | INTRAMUSCULAR | Status: DC | PRN

## 2019-09-23 MED ORDER — ONDANSETRON HCL 4 MG/2ML IJ SOLN
INTRAMUSCULAR | Status: AC
  Filled 2019-09-23: qty 2

## 2019-09-23 MED ORDER — EPHEDRINE SULFATE-NACL 50-0.9 MG/10ML-% IV SOSY
PREFILLED_SYRINGE | INTRAVENOUS | Status: DC | PRN
  Administered 2019-09-23 (×2): 5 mg via INTRAVENOUS
  Administered 2019-09-23: 10 mg via INTRAVENOUS
  Administered 2019-09-23 (×2): 5 mg via INTRAVENOUS

## 2019-09-23 MED ORDER — ROCURONIUM BROMIDE 10 MG/ML (PF) SYRINGE
PREFILLED_SYRINGE | INTRAVENOUS | Status: AC
  Filled 2019-09-23: qty 10

## 2019-09-23 MED ORDER — ACETAMINOPHEN 650 MG RE SUPP: 325.0000 mg | RECTAL | Status: DC | PRN

## 2019-09-23 MED ORDER — HYDRALAZINE HCL 20 MG/ML IJ SOLN: 5.0000 mg | INTRAMUSCULAR | Status: DC | PRN

## 2019-09-23 MED ORDER — POLYETHYLENE GLYCOL 3350 17 G PO PACK: 17.0000 g | PACK | Freq: Every day | ORAL | Status: DC | PRN

## 2019-09-23 MED ORDER — CEFAZOLIN SODIUM-DEXTROSE 2-4 GM/100ML-% IV SOLN
2.0000 g | INTRAVENOUS | Status: AC
  Administered 2019-09-23: 2 g via INTRAVENOUS
  Filled 2019-09-23: qty 100

## 2019-09-23 MED ORDER — PHENOL 1.4 % MT LIQD: 1.0000 | OROMUCOSAL | Status: DC | PRN

## 2019-09-23 MED ORDER — PHENYLEPHRINE 40 MCG/ML (10ML) SYRINGE FOR IV PUSH (FOR BLOOD PRESSURE SUPPORT)
PREFILLED_SYRINGE | INTRAVENOUS | Status: AC
  Filled 2019-09-23: qty 10

## 2019-09-23 MED ORDER — PROPOFOL 10 MG/ML IV BOLUS
INTRAVENOUS | Status: DC | PRN
  Administered 2019-09-23: 30 mg via INTRAVENOUS
  Administered 2019-09-23: 60 mg via INTRAVENOUS

## 2019-09-23 MED ORDER — GUAIFENESIN-DM 100-10 MG/5ML PO SYRP: 15.0000 mL | ORAL_SOLUTION | ORAL | Status: DC | PRN

## 2019-09-23 MED ORDER — ALUM & MAG HYDROXIDE-SIMETH 200-200-20 MG/5ML PO SUSP: 15.0000 mL | ORAL | Status: DC | PRN

## 2019-09-23 MED ORDER — SODIUM CHLORIDE 0.9 % IV SOLN
INTRAVENOUS | Status: DC | PRN
  Administered 2019-09-23: 500 mL

## 2019-09-23 MED ORDER — ACETAMINOPHEN 10 MG/ML IV SOLN
1000.0000 mg | Freq: Once | INTRAVENOUS | Status: DC | PRN
  Administered 2019-09-23: 1000 mg via INTRAVENOUS

## 2019-09-23 MED ORDER — FENTANYL CITRATE (PF) 100 MCG/2ML IJ SOLN
25.0000 ug | INTRAMUSCULAR | Status: DC | PRN
  Administered 2019-09-23: 50 ug via INTRAVENOUS

## 2019-09-23 MED ORDER — SODIUM CHLORIDE 0.9 % IV SOLN
INTRAVENOUS | Status: AC
  Filled 2019-09-23: qty 1.2

## 2019-09-23 MED ORDER — ACETAMINOPHEN 325 MG PO TABS: 325.0000 mg | ORAL_TABLET | ORAL | Status: DC | PRN

## 2019-09-23 MED ORDER — MORPHINE SULFATE (PF) 2 MG/ML IV SOLN: 2.0000 mg | INTRAVENOUS | Status: DC | PRN

## 2019-09-23 MED ORDER — METOPROLOL TARTRATE 5 MG/5ML IV SOLN: 2.0000 mg | INTRAVENOUS | Status: DC | PRN

## 2019-09-23 MED ORDER — CLEVIDIPINE BUTYRATE 0.5 MG/ML IV EMUL
INTRAVENOUS | Status: DC | PRN
  Administered 2019-09-23: 2 mg/h via INTRAVENOUS

## 2019-09-23 MED ORDER — LIDOCAINE 2% (20 MG/ML) 5 ML SYRINGE
INTRAMUSCULAR | Status: DC | PRN
  Administered 2019-09-23: 60 mg via INTRAVENOUS

## 2019-09-23 SURGICAL SUPPLY — 45 items
CANISTER SUCT 3000ML PPV (MISCELLANEOUS) ×3 IMPLANT
CANNULA VESSEL 3MM 2 BLNT TIP (CANNULA) ×6 IMPLANT
CATH ROBINSON RED A/P 18FR (CATHETERS) ×3 IMPLANT
CLIP LIGATING EXTRA MED SLVR (CLIP) ×3 IMPLANT
CLIP LIGATING EXTRA SM BLUE (MISCELLANEOUS) ×3 IMPLANT
COVER WAND RF STERILE (DRAPES) IMPLANT
DECANTER SPIKE VIAL GLASS SM (MISCELLANEOUS) IMPLANT
DERMABOND ADVANCED (GAUZE/BANDAGES/DRESSINGS) ×2
DERMABOND ADVANCED .7 DNX12 (GAUZE/BANDAGES/DRESSINGS) ×1 IMPLANT
DRAIN HEMOVAC 1/8 X 5 (WOUND CARE) IMPLANT
ELECT REM PT RETURN 9FT ADLT (ELECTROSURGICAL) ×3
ELECTRODE REM PT RTRN 9FT ADLT (ELECTROSURGICAL) ×1 IMPLANT
EVACUATOR SILICONE 100CC (DRAIN) IMPLANT
GLOVE BIO SURGEON STRL SZ 6.5 (GLOVE) ×2 IMPLANT
GLOVE BIO SURGEONS STRL SZ 6.5 (GLOVE) ×1
GLOVE BIOGEL PI IND STRL 6.5 (GLOVE) ×1 IMPLANT
GLOVE BIOGEL PI INDICATOR 6.5 (GLOVE) ×2
GLOVE SS BIOGEL STRL SZ 6.5 (GLOVE) ×1 IMPLANT
GLOVE SS BIOGEL STRL SZ 7.5 (GLOVE) ×1 IMPLANT
GLOVE SUPERSENSE BIOGEL SZ 6.5 (GLOVE) ×2
GLOVE SUPERSENSE BIOGEL SZ 7.5 (GLOVE) ×2
GLOVE SURG SS PI 6.0 STRL IVOR (GLOVE) ×3 IMPLANT
GOWN STRL REUS W/ TWL LRG LVL3 (GOWN DISPOSABLE) ×4 IMPLANT
GOWN STRL REUS W/TWL LRG LVL3 (GOWN DISPOSABLE) ×12
KIT BASIN OR (CUSTOM PROCEDURE TRAY) ×3 IMPLANT
KIT SHUNT ARGYLE CAROTID ART 6 (VASCULAR PRODUCTS) IMPLANT
KIT TURNOVER KIT B (KITS) ×3 IMPLANT
NEEDLE 22X1 1/2 (OR ONLY) (NEEDLE) IMPLANT
NS IRRIG 1000ML POUR BTL (IV SOLUTION) ×6 IMPLANT
PACK CAROTID (CUSTOM PROCEDURE TRAY) ×3 IMPLANT
PAD ARMBOARD 7.5X6 YLW CONV (MISCELLANEOUS) ×6 IMPLANT
PATCH HEMASHIELD 8X75 (Vascular Products) ×3 IMPLANT
POSITIONER HEAD DONUT 9IN (MISCELLANEOUS) ×3 IMPLANT
SHUNT CAROTID BYPASS 10 (VASCULAR PRODUCTS) ×6 IMPLANT
SHUNT CAROTID BYPASS 12FRX15.5 (VASCULAR PRODUCTS) IMPLANT
SUT ETHILON 3 0 PS 1 (SUTURE) IMPLANT
SUT PROLENE 6 0 CC (SUTURE) ×15 IMPLANT
SUT SILK 3 0 (SUTURE)
SUT SILK 3-0 18XBRD TIE 12 (SUTURE) IMPLANT
SUT VIC AB 3-0 SH 27 (SUTURE) ×6
SUT VIC AB 3-0 SH 27X BRD (SUTURE) ×2 IMPLANT
SUT VICRYL 4-0 PS2 18IN ABS (SUTURE) ×3 IMPLANT
SYR CONTROL 10ML LL (SYRINGE) IMPLANT
TOWEL GREEN STERILE (TOWEL DISPOSABLE) ×3 IMPLANT
WATER STERILE IRR 1000ML POUR (IV SOLUTION) ×3 IMPLANT

## 2019-09-23 NOTE — Op Note (Signed)
   OPERATIVE REPORT  DATE OF SURGERY: 09/23/2019  PATIENT: Roger Benjamin, 86 y.o. male MRN: [id number]  DOB: [birth year]  PRE-OPERATIVE DIAGNOSIS: Left Carotid Stenosis, Asymptomatic  POST-OPERATIVE DIAGNOSIS:  Same  PROCEDURE:  Left Carotid Endarterectomy with Dacron Patch Angioplasty  SURGEON:  Curt Jews, M.D.  PHYSICIAN ASSISTANT: Setzer PAC  The assistant was needed for exposure and to expedite the case  ANESTHESIA:   general  EBL: Less than 200 ml  Total I/O In: 700 [I.V.:700] Out: 25 [Blood:25]  BLOOD ADMINISTERED: none  DRAINS: none   SPECIMEN: none  COUNTS CORRECT:  YES  PLAN OF CARE: Admit to inpatient   PATIENT DISPOSITION:  PACU - hemodynamically stable and neurologically intact.  PROCEDURE DETAILS: The patient was taken to the operating room placed in supine position.  General anesthesia was administered.  The neck was prepped and draped in the usual sterile fashion.  An incision was made anterior to the sternocleidomastoid and carried down through the platysma with electrocautery.  The sternocleidomastoid was reflected posteriorly and the carotid sheath was opened.  The facial vein was ligated with 2-0 silk ties and divided.  The common carotid artery was encircled with an umbilical tape and Rummel tourniquet.  The vagus nerve was identified and preserved.  Dissection was continued onto the carotid bifurcation.  The superior thyroid artery was encircled with a 2-0 silk Potts tie.  The external carotid was encircled with a blue vessel loop and the internal carotid was encircled with an umbilical tape and Rummel tourniquet.  The hypoglossal nerve was identified and preserved.  The patient was given systemic heparin and after adequate circulation time, the internal, external and common carotid arteries were occluded with vascular clamps.  The common carotid artery was opened with an 11 blade and extended  longitudinally with Potts scissors.  There was excellent  backbleeding and therefore a shunt was not used the endarterectomy was begun on the common carotid artery and the plaque was divided proximally with Potts scissors.  The endarterectomy was continued onto the bifurcation.  The external carotid was endarterectomized with an eversion technique and the internal carotid was endarterectomized in an open fashion.  Remaining atheromatous debris was removed from the endarterectomy plane.  A Finesse Hemashield Dacron patch was brought onto the field and was sewn as a patch angioplasty with a running 6-0 Prolene suture.  Prior to completion of the closure the shunt was removed and the usual flushing maneuvers were undertaken.  The anastomosis was completed and flow was restored first to the external and then the internal carotid artery.  Excellent flow characteristics were noted with hand-held Doppler in the internal and external carotid arteries.  The patient was given 50 mg of protamine to reverse the heparin.  The wounds were irrigated with saline.  Hemostasis was obtained with electrocautery.  The wounds were closed with 3-0 Vicryl to reapproximate the sternocleidomastoid over the carotid sheath.  The platysma was lysed with a running 3-0 Vicryl suture.  The skin was closed with a 4-0 subcuticular Vicryl stitch.  Dermabond was applied.  The patient was awakened neurologically intact in the operating room and transferred to the recovery room in stable condition   Curt Jews, M.D. 09/23/2019 1:38 PM

## 2019-09-23 NOTE — Anesthesia Procedure Notes (Signed)
Arterial Line Insertion Start/End7/28/2021 9:45 AM, 09/23/2019 9:55 AM Performed by: Lance Coon, CRNA, CRNA  Preanesthetic checklist: patient identified, IV checked, site marked, risks and benefits discussed, surgical consent, monitors and equipment checked, pre-op evaluation, timeout performed and anesthesia consent Lidocaine 1% used for infiltration Left, radial was placed Catheter size: 20 G Hand hygiene performed , maximum sterile barriers used  and Seldinger technique used  Attempts: 2 Procedure performed without using ultrasound guided technique. Following insertion, dressing applied and Biopatch. Post procedure assessment: normal and unchanged  Patient tolerated the procedure well with no immediate complications.

## 2019-09-23 NOTE — Progress Notes (Signed)
Patient to room 4E11 from PACU. Vital signs obtained. On monitor CCMD notified. Alert and oriented to room and call light. Call bell within reach.  Era Bumpers, RN

## 2019-09-23 NOTE — Interval H&P Note (Signed)
History and Physical Interval Note:  09/23/2019 10:32 AM  Roger Benjamin  has presented today for surgery, with the diagnosis of LEFT CAROTID ARTERY STENOSIS.  The various methods of treatment have been discussed with the patient and family. After consideration of risks, benefits and other options for treatment, the patient has consented to  Procedure(s): ENDARTERECTOMY CAROTID (Left) as a surgical intervention.  The patient's history has been reviewed, patient examined, no change in status, stable for surgery.  I have reviewed the patient's chart and labs.  Questions were answered to the patient's satisfaction.     Curt Jews

## 2019-09-23 NOTE — Anesthesia Procedure Notes (Signed)
Procedure Name: Intubation Date/Time: 09/23/2019 10:58 AM Performed by: Lance Coon, CRNA Pre-anesthesia Checklist: Patient identified, Emergency Drugs available, Suction available and Patient being monitored Patient Re-evaluated:Patient Re-evaluated prior to induction Oxygen Delivery Method: Circle System Utilized Preoxygenation: Pre-oxygenation with 100% oxygen Induction Type: IV induction Ventilation: Mask ventilation without difficulty and Oral airway inserted - appropriate to patient size Laryngoscope Size: Miller and 3 Grade View: Grade I Tube type: Oral Tube size: 7.5 mm Number of attempts: 1 Airway Equipment and Method: Stylet and Oral airway Placement Confirmation: ETT inserted through vocal cords under direct vision,  positive ETCO2 and breath sounds checked- equal and bilateral Secured at: 21 cm Tube secured with: Tape Dental Injury: Teeth and Oropharynx as per pre-operative assessment  Comments: Intubated by lauren ferm, srna under supervision of md and crna

## 2019-09-23 NOTE — Transfer of Care (Signed)
Immediate Anesthesia Transfer of Care Note  Patient: Roger Benjamin  Procedure(s) Performed: ENDARTERECTOMY LEFT CAROTID with PATCH ANGIOPLASTY (Left Neck)  Patient Location: PACU  Anesthesia Type:General  Level of Consciousness: drowsy and patient cooperative  Airway & Oxygen Therapy: Patient Spontanous Breathing and Patient connected to nasal cannula oxygen  Post-op Assessment: Report given to RN and Post -op Vital signs reviewed and stable  Post vital signs: Reviewed and stable  Last Vitals:  Vitals Value Taken Time  BP 110/64 09/23/19 1351  Temp    Pulse 79 09/23/19 1353  Resp 16 09/23/19 1353  SpO2 97 % 09/23/19 1353  Vitals shown include unvalidated device data.  Last Pain:  Vitals:   09/23/19 0852  TempSrc:   PainSc: 0-No pain      Patients Stated Pain Goal: 5 (00/34/96 1164)  Complications: No complications documented.

## 2019-09-23 NOTE — Progress Notes (Signed)
  Day of Surgery Note    Subjective: Pain controlled.  BP soft but not requiring support. PACU RN states incisional swelling is stable.   Vitals:   09/23/19 1435 09/23/19 1450  BP: (!) 91/56 (!) 91/56  Pulse: 76 75  Resp: 17 18  Temp:  97.6 F (36.4 C)  SpO2: 96% 97%   Neuro: alert, tongue midline, face symmetrical Incisions:   Left neck incision is edematous and appears to be in the subcu tissues not the wound bed itself. Soft. No oozing Extremities:  Moves all 4 well. 5/5 bilateral grip strength Cardiac:  RRR Lungs:  nonlabored    Assessment/Plan:  This is a 86 y.o. male who is s/p left CEA. Neuro intact. VSS. Continue current treatment plan/monitoring.  To progressive care unit when bed is ready.  Risa Grill, PA-C 09/23/2019 3:22 PM [phone]

## 2019-09-24 ENCOUNTER — Encounter (HOSPITAL_COMMUNITY): Payer: Self-pay | Admitting: Vascular Surgery

## 2019-09-24 LAB — LIPID PANEL
Cholesterol: 158 mg/dL (ref 0–200)
HDL: 50 mg/dL (ref >40)
LDL Cholesterol: 99 mg/dL (ref 0–99)
Total CHOL/HDL Ratio: 3.2 RATIO
Triglycerides: 44 mg/dL (ref <150)
VLDL: 9 mg/dL (ref 0–40)

## 2019-09-24 LAB — BASIC METABOLIC PANEL
Anion gap: 7 (ref 5–15)
BUN: 12 mg/dL (ref 8–23)
CO2: 24 mmol/L (ref 22–32)
Calcium: 8.4 mg/dL — ABNORMAL LOW (ref 8.9–10.3)
Chloride: 105 mmol/L (ref 98–111)
Creatinine, Ser: 0.68 mg/dL (ref 0.61–1.24)
GFR calc Af Amer: >60 mL/min (ref >60)
GFR calc non Af Amer: >60 mL/min (ref >60)
Glucose, Bld: 107 mg/dL — ABNORMAL HIGH (ref 70–99)
Potassium: 3.7 mmol/L (ref 3.5–5.1)
Sodium: 136 mmol/L (ref 135–145)

## 2019-09-24 LAB — CBC
HCT: 40 % (ref 39.0–52.0)
Hemoglobin: 13.4 g/dL (ref 13.0–17.0)
MCH: 31.5 pg (ref 26.0–34.0)
MCHC: 33.5 g/dL (ref 30.0–36.0)
MCV: 93.9 fL (ref 80.0–100.0)
Platelets: 222 10*3/uL (ref 150–400)
RBC: 4.26 MIL/uL (ref 4.22–5.81)
RDW: 12.7 % (ref 11.5–15.5)
WBC: 14.9 10*3/uL — ABNORMAL HIGH (ref 4.0–10.5)
nRBC: 0 % (ref 0.0–0.2)

## 2019-09-24 MED ORDER — HYDROCODONE-ACETAMINOPHEN 5-325 MG PO TABS: 1.0000 | ORAL_TABLET | ORAL | 0 refills | Status: DC | PRN

## 2019-09-24 NOTE — Discharge Instructions (Signed)
   [facility]  Discharge Instructions   Carotid Endarterectomy (CEA)  Please refer to the following instructions for your post-procedure care. Your surgeon or physician assistant will discuss any changes with you.  Activity  You are encouraged to walk as much as you can. You can slowly return to normal activities but must avoid strenuous activity and heavy lifting until your doctor tell you it's OK. Avoid activities such as vacuuming or swinging a golf club. You can drive after one week if you are comfortable and you are no longer taking prescription pain medications. It is normal to feel tired for serval weeks after your surgery. It is also normal to have difficulty with sleep habits, eating, and bowel movements after surgery. These will go away with time.  Bathing/Showering  You may shower after you come home. Do not soak in a bathtub, hot tub, or swim until the incision heals completely.  Incision Care  Shower every day. Clean your incision with mild soap and water. Pat the area dry with a clean towel. You do not need a bandage unless otherwise instructed. Do not apply any ointments or creams to your incision. You may have skin glue on your incision. Do not peel it off. It will come off on its own in about one week. Your incision may feel thickened and raised for several weeks after your surgery. This is normal and the skin will soften over time. For Men Only: It's OK to shave around the incision but do not shave the incision itself for 2 weeks. It is common to have numbness under your chin that could last for several months.  Diet  Resume your normal diet. There are no special food restrictions following this procedure. A low fat/low cholesterol diet is recommended for all patients with vascular disease. In order to heal from your surgery, it is CRITICAL to get adequate nutrition. Your body requires vitamins, minerals, and protein. Vegetables are the best  source of vitamins and minerals. Vegetables also provide the perfect balance of protein. Processed food has little nutritional value, so try to avoid this.        Medications  Resume taking all of your medications unless your doctor or physician assistant tells you not to. If your incision is causing pain, you may take over-the- counter pain relievers such as acetaminophen (Tylenol). If you were prescribed a stronger pain medication, please be aware these medications can cause nausea and constipation. Prevent nausea by taking the medication with a snack or meal. Avoid constipation by drinking plenty of fluids and eating foods with a high amount of fiber, such as fruits, vegetables, and grains. Do not take Tylenol if you are taking prescription pain medications.  Follow Up  Our office will schedule a follow up appointment 2-3 weeks following discharge.  Please call us immediately for any of the following conditions  Increased pain, redness, drainage (pus) from your incision site. Fever of 101 degrees or higher. If you should develop stroke (slurred speech, difficulty swallowing, weakness on one side of your body, loss of vision) you should call 911 and go to the nearest emergency room.  Reduce your risk of vascular disease:  Stop smoking. If you would like help call [facility] at 1-800-QUIT-NOW (1-800-784-8669) or [place] at 336-586-4000. Manage your cholesterol Maintain a desired weight Control your diabetes Keep your blood pressure down  If you have any questions, please call the office at [phone].   

## 2019-09-24 NOTE — Progress Notes (Addendum)
  Progress Note    09/24/2019 7:08 AM 1 Day Post-Op  Subjective: He did not sleep well secondary to being uncomfortable in the hospital bed.  He denies chest pain or shortness of breath.  Minimal incisional pain.  Tolerating diet.  Voiding spontaneously   Vitals:   09/24/19 0300 09/24/19 0400  BP: (!) 149/62   Pulse: 77   Resp: 17   Temp: 97.8 F (36.6 C) 98.2 F (36.8 C)  SpO2: 95%     Physical Exam: Cardiac: Heart rate and rhythm are regular Lungs: Clear to auscultation bilaterally Incisions: Neck incision well approximated.  There is moderate edema.  Subcutaneous tissues are soft.  There is no bleeding or ecchymosis Extremities: Moves all extremities well.  5 out of 5 bilateral grip strength Abdomen: Soft and nondistended Neurologic: The patient is alert and oriented x4.  Tongue is midline.  There is slight left lip droop. CBC    Component Value Date/Time   WBC 14.9 (H) 09/24/2019 0500   RBC 4.26 09/24/2019 0500   HGB 13.4 09/24/2019 0500   HCT 40.0 09/24/2019 0500   PLT 222 09/24/2019 0500   MCV 93.9 09/24/2019 0500   MCH 31.5 09/24/2019 0500   MCHC 33.5 09/24/2019 0500   RDW 12.7 09/24/2019 0500   LYMPHSABS 1.5 08/02/2019 1645   MONOABS 1.6 (H) 08/02/2019 1645   EOSABS 0.4 08/02/2019 1645   BASOSABS 0.1 08/02/2019 1645    BMET    Component Value Date/Time   NA 136 09/24/2019 0500   K 3.7 09/24/2019 0500   CL 105 09/24/2019 0500   CO2 24 09/24/2019 0500   GLUCOSE 107 (H) 09/24/2019 0500   BUN 12 09/24/2019 0500   CREATININE 0.68 09/24/2019 0500   CALCIUM 8.4 (L) 09/24/2019 0500   GFRNONAA >60 09/24/2019 0500   GFRAA >60 09/24/2019 0500     Intake/Output Summary (Last 24 hours) at 09/24/2019 0708 Last data filed at 09/24/2019 0300 Gross per 24 hour  Intake 2060 ml  Output 675 ml  Net 1385 ml    HOSPITAL MEDICATIONS Scheduled Meds: . ascorbic acid  500 mg Oral Daily  . aspirin EC  81 mg Oral QODAY  . docusate sodium  100 mg Oral Daily  .  metoprolol succinate  25 mg Oral Daily  . pantoprazole  40 mg Oral Daily   Continuous Infusions: . sodium chloride    . sodium chloride Stopped (09/23/19 2200)  . magnesium sulfate bolus IVPB     PRN Meds:.sodium chloride, acetaminophen **OR** acetaminophen, alum & mag hydroxide-simeth, bisacodyl, guaiFENesin-dextromethorphan, hydrALAZINE, labetalol, magnesium sulfate bolus IVPB, metoprolol tartrate, morphine injection, ondansetron, oxyCODONE-acetaminophen, phenol, polyethylene glycol, potassium chloride  Assessment: POD 2 left carotid endarterectomy.  Hemodynamically stable.  Neurologically intact.  Hemoglobin stable.  Plan: -Discharge home.  Postoperative incisional care and activity discussed.  Encouraged him to call our office immediately for concerns regarding incisional swelling or neurologic event. -Continue aspirin  Risa Grill, PA-C [facility] [phone] 09/24/2019  7:08 AM

## 2019-09-24 NOTE — Discharge Summary (Signed)
Discharge Summary     Roger Benjamin 23-Sep-1931 86 y.o. male  [id number]  Admission Date: 09/23/2019  Discharge Date: 09/24/19 Physician: Rosetta Posner, MD  Admission Diagnosis: Left carotid artery stenosis [I65.22]   HPI:   This is a 86 y.o. male who presents with a history of progressive left carotid artery stenosis. HE has no neurologic symptoms suggestive of stroke or TIA.  Hospital Course:  The patient was admitted to the hospital and taken to the operating room on 09/23/2019 and underwent left carotid endarterectomy.    Findings: calcified plaque  The pt tolerated the procedure well and was transported to the PACU in excellent condition. He had mild edema of his incision without evidence of expanding hematoma.   By POD 1, the pt neuro status intact. He had slight lip droop consistent with mild marginal mandibular nerve injury.  He was chewing, swallowing without difficulty. Speech fluent. Pain controlled. Hemodynamically stable. Hemoglobin stable. His neck edema was stable and soft. Normal upper and lower extremity strength and motor function.  The remainder of the hospital course consisted of increasing mobilization and increasing intake of solids without difficulty.   Recent Labs    09/23/19 0859 09/24/19 0500  NA 137 136  K 4.4 3.7  CL 104 105  CO2 25 24  GLUCOSE 92 107*  BUN 16 12  CALCIUM 9.2 8.4*   Recent Labs    09/23/19 0859 09/24/19 0500  WBC 11.8* 14.9*  HGB 15.3 13.4  HCT 47.0 40.0  PLT 259 222   Recent Labs    09/23/19 0859  INR 1.1     Discharge Instructions    Discharge patient   Complete by: As directed    Discharge disposition: 01-Home or Self Care   Discharge patient date: 09/24/2019      Discharge Diagnosis:  Left carotid artery stenosis [I65.22]  Secondary Diagnosis: Patient Active Problem List   Diagnosis Date Noted  . Left carotid artery stenosis 09/23/2019   Past Medical History:  Diagnosis Date  . Carotid  artery stenosis   . Hypertension    Unsure is on Metoprolol  . Skin cancer     Allergies as of 09/24/2019      Reactions   Tape    Needs paper tape      Medication List    TAKE these medications   ascorbic acid 500 MG tablet Commonly known as: VITAMIN C Take 500 mg by mouth daily.   aspirin EC 81 MG tablet Take 81 mg by mouth every other day. Swallow whole.   HYDROcodone-acetaminophen 5-325 MG tablet Commonly known as: NORCO/VICODIN Take 1 tablet by mouth every 4 (four) hours as needed for moderate pain.   metoprolol succinate 25 MG 24 hr tablet Commonly known as: TOPROL-XL Take 25 mg by mouth daily.   multivitamin with minerals tablet Take 1 tablet by mouth daily.        [facility] Discharge Instructions Carotid Endarterectomy (CEA)  Please refer to the following instructions for your post-procedure care. Your surgeon or physician assistant will discuss any changes with you.  Activity  You are encouraged to walk as much as you can. You can slowly return to normal activities but must avoid strenuous activity and heavy lifting until your doctor tell you it's OK. Avoid activities such as vacuuming or swinging a golf club. You can drive after one week if you are comfortable and you are no longer taking prescription pain medications. It is  normal to feel tired for serval weeks after your surgery. It is also normal to have difficulty with sleep habits, eating, and bowel movements after surgery. These will go away with time.  Bathing/Showering  You may shower after you come home. Do not soak in a bathtub, hot tub, or swim until the incision heals completely.  Incision Care  Shower every day. Clean your incision with mild soap and water. Pat the area dry with a clean towel. You do not need a bandage unless otherwise instructed. Do not apply any ointments or creams to your incision. You may have skin glue on your incision. Do not peel it  off. It will come off on its own in about one week. Your incision may feel thickened and raised for several weeks after your surgery. This is normal and the skin will soften over time. For Men Only: It's OK to shave around the incision but do not shave the incision itself for 2 weeks. It is common to have numbness under your chin that could last for several months.  Diet  Resume your normal diet. There are no special food restrictions following this procedure. A low fat/low cholesterol diet is recommended for all patients with vascular disease. In order to heal from your surgery, it is CRITICAL to get adequate nutrition. Your body requires vitamins, minerals, and protein. Vegetables are the best source of vitamins and minerals. Vegetables also provide the perfect balance of protein. Processed food has little nutritional value, so try to avoid this.  Medications  Resume taking all of your medications unless your doctor or physician assistant tells you not to.  If your incision is causing pain, you may take over-the- counter pain relievers such as acetaminophen (Tylenol). If you were prescribed a stronger pain medication, please be aware these medications can cause nausea and constipation.  Prevent nausea by taking the medication with a snack or meal. Avoid constipation by drinking plenty of fluids and eating foods with a high amount of fiber, such as fruits, vegetables, and grains.  Do not take Tylenol if you are taking prescription pain medications.  Follow Up  Our office will schedule a follow up appointment 2-3 weeks following discharge.  Please call us immediately for any of the following conditions  . Increased pain, redness, drainage (pus) from your incision site. . Fever of 101 degrees or higher. . If you should develop stroke (slurred speech, difficulty swallowing, weakness on one side of your body, loss of vision) you should call 911 and go to the nearest emergency room. .  Reduce your  risk of vascular disease:  . Stop smoking. If you would like help call [facility] at 1-800-QUIT-NOW (626) 272-7349) or [place] at 720-486-5392. . Manage your cholesterol . Maintain a desired weight . Control your diabetes . Keep your blood pressure down .  If you have any questions, please call the office at [phone].  Prescriptions given: 1.   Roxicet #20 No Refill   Disposition: home  Patient's condition: is Excellent  Follow up: 1. Dr. Donnetta Hutching in 2 weeks.   Risa Grill, PA-C [facility] [phone]   --- For [facility] use ---   Modified Rankin score at D/C (0-6): 0  IV medication needed for:  1. Hypertension: No 2. Hypotension: No  Post-op Complications: No  1. Post-op CVA or TIA: No  If yes: Event classification (right eye, left eye, right cortical, left cortical, verterobasilar, other):   If yes: Timing of event (intra-op, <6 hrs  post-op, >=6 hrs post-op, unknown):   2. CN injury: No  If yes: CN n/a injuried   3. Myocardial infarction: No  If yes: Dx by (EKG or clinical, Troponin):   4.  CHF: No  5.  Dysrhythmia (new): No  6. Wound infection: No  7. Reperfusion symptoms: No  8. Return to OR: No  If yes: return to OR for (bleeding, neurologic, other CEA incision, other):   Discharge medications: Statin use:  No ASA use:  Yes   Beta blocker use:  Yes ACE-Inhibitor use:  No  ARB use:  No CCB use: No P2Y12 Antagonist use: No, [ ]  Plavix, [ ]  Plasugrel, [ ]  Ticlopinine, [ ]  Ticagrelor, [ ]  Other, [ ]  No for medical reason, [ ]  Non-compliant, [ ]  Not-indicated Anti-coagulant use:  No, [ ]  Warfarin, [ ]  Rivaroxaban, [ ]  Dabigatran,

## 2019-09-24 NOTE — Anesthesia Postprocedure Evaluation (Signed)
Anesthesia Post Note  Patient: Roger Benjamin  Procedure(s) Performed: ENDARTERECTOMY LEFT CAROTID with PATCH ANGIOPLASTY (Left Neck)     Patient location during evaluation: PACU Anesthesia Type: General Level of consciousness: awake and alert Pain management: pain level controlled Vital Signs Assessment: post-procedure vital signs reviewed and stable Respiratory status: spontaneous breathing, nonlabored ventilation, respiratory function stable and patient connected to nasal cannula oxygen Cardiovascular status: blood pressure returned to baseline and stable Postop Assessment: no apparent nausea or vomiting Anesthetic complications: no   No complications documented.  Last Vitals:  Vitals:   09/24/19 0400 09/24/19 0801  BP:  (!) 144/68  Pulse:  85  Resp:  18  Temp: 36.8 C 36.7 C  SpO2:  96%    Last Pain:  Vitals:   09/24/19 0801  TempSrc: Oral  PainSc:                  [name]

## 2019-10-13 ENCOUNTER — Other Ambulatory Visit: Payer: Self-pay

## 2019-10-13 ENCOUNTER — Ambulatory Visit (INDEPENDENT_AMBULATORY_CARE_PROVIDER_SITE_OTHER): Payer: Medicare Other | Admitting: Vascular Surgery

## 2019-10-13 ENCOUNTER — Encounter: Payer: Medicare Other | Admitting: Vascular Surgery

## 2019-10-13 ENCOUNTER — Encounter: Payer: Self-pay | Admitting: Vascular Surgery

## 2019-10-13 VITALS — BP 162/77 | HR 55 | Temp 98.0°F | Resp 18 | Ht 73.0 in | Wt 185.0 lb

## 2019-10-13 DIAGNOSIS — I6529 Occlusion and stenosis of unspecified carotid artery: Secondary | ICD-10-CM

## 2019-10-13 NOTE — Progress Notes (Signed)
   Patient name: Roger Benjamin MRN: [id number] DOB: 27-Feb-1931 Sex: male  REASON FOR VISIT: Left carotid endarterectomy for severe asymptomatic and 09/23/2019  HPI: Roger Benjamin is a 86 y.o. male here today for follow-up.  He did extremely well surgery was discharged home on postoperative day 1.  Full activities without difficulty.  Current Outpatient Medications  Medication Sig Dispense Refill  . ascorbic acid (VITAMIN C) 500 MG tablet Take 500 mg by mouth daily.    Marland Kitchen aspirin EC 81 MG tablet Take 81 mg by mouth every other day. Swallow whole.    . metoprolol succinate (TOPROL-XL) 25 MG 24 hr tablet Take 25 mg by mouth daily.    . Multiple Vitamins-Minerals (MULTIVITAMIN WITH MINERALS) tablet Take 1 tablet by mouth daily.     No current facility-administered medications for this visit.     PHYSICAL EXAM: Vitals:   10/13/19 0934 10/13/19 0936  BP: (!) 173/72 (!) 162/77  Pulse: (!) 55   Resp: 18   Temp: 98 F (36.7 C)   SpO2: 97%   Weight: 185 lb (83.9 kg)   Height: 6\' 1"  (1.854 m)     GENERAL: The patient is a well-nourished male, in no acute distress. The vital signs are documented above. Left neck incision is healed.  I do not hear a bruit in his right or left neck  MEDICAL ISSUES: Stable status post left carotid endarterectomy asymptomatic stenosis.  He will continue his usual activity.  We will see him again in 9 months with repeat carotid duplex   Rosetta Posner, MD [facility] Tel [phone] Pager [phone]

## 2019-10-20 ENCOUNTER — Telehealth: Payer: Self-pay

## 2019-10-20 NOTE — Telephone Encounter (Signed)
Pt is not interested in having sleep study at this time. Pt has just recently had surgery and is recovering from that. Also, patient states he has changed his sleep time and has seen a improvement. Pt was told to call back when he is ready to schedule HST.

## 2019-11-26 ENCOUNTER — Telehealth: Payer: Self-pay

## 2019-11-26 ENCOUNTER — Other Ambulatory Visit: Payer: Self-pay

## 2019-11-26 DIAGNOSIS — I6522 Occlusion and stenosis of left carotid artery: Secondary | ICD-10-CM

## 2019-11-26 NOTE — Telephone Encounter (Signed)
Patient c/o of aphasic symptoms lasting <30 min on Tuesday 09/28. "I wanted a club sandwich and I couldn't find my words" Patient has documented carotid disease. Discussed with PA and patient will come in Monday for duplex and to be seen by PA. Called patient back and informed of appt times, and to monitor for further symptoms. Instructed him to call back with further questions or call 911 if any more stroke s/s. Patient verbalized understanding.

## 2019-11-30 ENCOUNTER — Ambulatory Visit (INDEPENDENT_AMBULATORY_CARE_PROVIDER_SITE_OTHER): Payer: Self-pay | Admitting: Physician Assistant

## 2019-11-30 ENCOUNTER — Other Ambulatory Visit: Payer: Self-pay

## 2019-11-30 ENCOUNTER — Encounter: Payer: Self-pay | Admitting: Physician Assistant

## 2019-11-30 ENCOUNTER — Ambulatory Visit (HOSPITAL_COMMUNITY)
Admission: RE | Admit: 2019-11-30 | Discharge: 2019-11-30 | Disposition: A | Discharge status: Home / Self Care | Payer: Medicare Other | Source: Ambulatory Visit | Attending: Surgery | Admitting: Surgery

## 2019-11-30 VITALS — BP 152/77 | HR 59 | Temp 98.6°F | Resp 20 | Ht 73.0 in | Wt 185.0 lb

## 2019-11-30 DIAGNOSIS — Z9889 Other specified postprocedural states: Secondary | ICD-10-CM

## 2019-11-30 DIAGNOSIS — I6522 Occlusion and stenosis of left carotid artery: Secondary | ICD-10-CM | POA: Insufficient documentation

## 2019-11-30 DIAGNOSIS — I6529 Occlusion and stenosis of unspecified carotid artery: Secondary | ICD-10-CM

## 2019-11-30 NOTE — Progress Notes (Signed)
POST OPERATIVE OFFICE NOTE    CC:  F/u for surgery  HPI:  This is a 86 y.o. male who is s/p left carotid endarterectomy 09/23/19 by Dr. Donnetta Hutching for asymptomatic high grade left ICA stenosis. He did very well following surgery. Dr. Donnetta Hutching last saw him on 10/13/19 for his post op follow up and he was doing all his ADL without difficulty and his left neck incision was healed.  He presents today due to one episode of aphasia last week. He says that he was at [employer] and he went to order and he knew what he wanted and could see it on the menu but he was not able to speak. He says that he had to go get his friend to help him order. He reports that by the time he had finished eating it had resolved. He has not had any recurrence since. He also explains that since his surgery he has felt overall increase in fatigue and not able to do some of the activities of daily living. He explains that one day he can balance his check book and then next he is not able to. He also is more forgetful and gets confused more than he use to. He says he is noticing it more and more and also that some of his workers have asked him about it. 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 He otherwise explains that he feels great and has not noticed any amaurosis fugax or monocular blindness, slurred speech, facial drooping, or weakness or numbness of upper or lower extremities  Allergies  Allergen Reactions   Tape     Needs paper tape    Current Outpatient Medications  Medication Sig Dispense Refill   ascorbic acid (VITAMIN C) 500 MG tablet Take 500 mg by mouth daily.     aspirin EC 81 MG tablet Take 81 mg by mouth every other day. Swallow whole.     metoprolol succinate (TOPROL-XL) 25 MG 24 hr tablet Take 25 mg by mouth daily.     Multiple Vitamins-Minerals (MULTIVITAMIN WITH MINERALS) tablet Take 1 tablet by mouth daily.     No current facility-administered medications for this visit.     ROS:  See  HPI  Physical Exam:  There were no vitals filed for this visit.  General:well appearing, well nourished, not in distress Chest: regular rate and rhythm Lungs: non labored Incision: left neck incision is well healed Extremities: moving all extremities without deficits. 5/5 strength and motor bilaterally. Extremities well perfused and warm Neuro: CN intact. Speech coherent. Smile symmetric. Tongue midline   Non Invasive Vascular lab: 11/30/19 Right Carotid Findings:  +----------+--------+--------+--------+----------------------+--------+         PSV cm/s EDV cm/s Stenosis Plaque Description   Comments   +----------+--------+--------+--------+----------------------+--------+   CCA Prox  103    11                            +----------+--------+--------+--------+----------------------+--------+   CCA Mid   130    15         heterogenous             +----------+--------+--------+--------+----------------------+--------+   CCA Distal 90    11         calcific               +----------+--------+--------+--------+----------------------+--------+   ICA Prox  67    13  irregular and calcific        +----------+--------+--------+--------+----------------------+--------+   ICA Mid   131    27    1-39%   calcific               +----------+--------+--------+--------+----------------------+--------+   ICA Distal 179    30                            +----------+--------+--------+--------+----------------------+--------+   ECA     120              irregular and calcific        +----------+--------+--------+--------+----------------------+--------+   +----------+--------+-------+--------+-------------------+         PSV cm/s EDV cms Describe Arm Pressure (mmHG)   +----------+--------+-------+--------+-------------------+   Subclavian 302          Stenotic              +----------+--------+-------+--------+-------------------+   +---------+--------+--------+----------+   Vertebral PSV cm/s EDV cm/s Retrograde   +---------+--------+--------+----------+     Left Carotid Findings:  +----------+--------+--------+--------+------------------+--------+         PSV cm/s EDV cm/s Stenosis Plaque Description Comments   +----------+--------+--------+--------+------------------+--------+   CCA Prox  101    17                          +----------+--------+--------+--------+------------------+--------+   CCA Mid   94    16         heterogenous           +----------+--------+--------+--------+------------------+--------+   CCA Distal 72    12         homogeneous            +----------+--------+--------+--------+------------------+--------+   ICA Prox  41    9                           +----------+--------+--------+--------+------------------+--------+   ICA Mid   31    5                           +----------+--------+--------+--------+------------------+--------+   ICA Distal 241    28                          +----------+--------+--------+--------+------------------+--------+   ECA     567    71    >50%                    +----------+--------+--------+--------+------------------+--------+   +----------+--------+--------+----------------+-------------------+         PSV cm/s EDV cm/s Describe     Arm Pressure (mmHG)   +----------+--------+--------+----------------+-------------------+   Subclavian 217         Multiphasic, WNL              +----------+--------+--------+----------------+-------------------+   +---------+--------+--------+--------------+   Vertebral PSV cm/s EDV cm/s Not identified   +---------+--------+--------+--------------+     Assessment/Plan:  This is a 86 y.o. male who is s/p left carotid endarterectomy by Dr. Donnetta Hutching on 09/23/19 for asymptomatic carotid stenosis. His carotid duplex today shows patent surgical area with 1-39% right ICA stenosis. With these findings I do not suspect that his episode of aphasia is due to his carotid disease. Additionally his overall fatigue and early onset dementia symptoms are likely not secondary to his carotid stenosis. I have recommended that he follow up with  his PCP and will make referral to Neurology to have him evaluated. He is overall very active and at a high functioning level for an 86 year old. I think some of these changes may be normal age related changes that he is just not previously experienced - He will continue his Aspirin - He will follow up with [place] for his routinely scheduled 9 month post op visit with repeat carotid duplex -I have made a referral for him to see a Neurologist for evaluation of dementia   Karoline Caldwell, PA-C [facility] [phone]  Clinic MD: Dr. Trula Slade

## 2019-12-03 ENCOUNTER — Other Ambulatory Visit: Payer: Self-pay | Admitting: *Deleted

## 2019-12-03 DIAGNOSIS — Z9889 Other specified postprocedural states: Secondary | ICD-10-CM

## 2020-01-04 ENCOUNTER — Encounter: Payer: Self-pay | Admitting: Neurology

## 2020-01-04 ENCOUNTER — Telehealth: Payer: Self-pay | Admitting: Neurology

## 2020-01-04 ENCOUNTER — Ambulatory Visit (INDEPENDENT_AMBULATORY_CARE_PROVIDER_SITE_OTHER): Payer: Medicare Other | Admitting: Neurology

## 2020-01-04 VITALS — BP 146/76 | HR 61 | Ht 73.0 in | Wt 187.0 lb

## 2020-01-04 DIAGNOSIS — Z9889 Other specified postprocedural states: Secondary | ICD-10-CM

## 2020-01-04 DIAGNOSIS — I6522 Occlusion and stenosis of left carotid artery: Secondary | ICD-10-CM | POA: Diagnosis not present

## 2020-01-04 DIAGNOSIS — R413 Other amnesia: Secondary | ICD-10-CM

## 2020-01-04 DIAGNOSIS — R4789 Other speech disturbances: Secondary | ICD-10-CM

## 2020-01-04 NOTE — Patient Instructions (Addendum)
For work-up of your memory issues and recent history of word finding difficulty I would like to proceed with brain scan, called MRI and call you with the test results. We will have to schedule you for this on a separate date. This test requires authorization from your insurance, and we will take care of the insurance process.  Also, it may help to reevaluate your hearing loss on the left side.  I reviewed Dr. Pollie Friar records from November 2020 and he did suggest the possibility of a hearing aid.  If you have sleep apnea I would like for you to consider treatment with a CPAP or AutoPap machine as we discussed before.  Also, we recently discussed the possibility of a sleep test at home.  Please stop in the sleep lab to schedule your home sleep test.  Underlying obstructive sleep apnea can affect your cognitive function.  Please start using a cane for gait safety.  You have had some unsteadiness especially with turns.  Please try to hydrate better with water and drink about 6 to 8 cups of water per day, 8 ounce size each.  Since you have an upcoming physical with your primary care physician, please ask them to add to your routine blood work vitamin B12 and TSH, which is a screening test for thyroid disease.

## 2020-01-04 NOTE — Telephone Encounter (Signed)
Medicare/bcbs supp order sent to GI. No auth they will reach out to the patient to schedule.  
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
## 2020-01-11 ENCOUNTER — Other Ambulatory Visit: Payer: Self-pay

## 2020-01-11 ENCOUNTER — Ambulatory Visit
Admission: RE | Admit: 2020-01-11 | Discharge: 2020-01-11 | Disposition: A | Discharge status: Home / Self Care | Payer: Medicare Other | Source: Ambulatory Visit | Attending: Neurology | Admitting: Neurology

## 2020-01-11 DIAGNOSIS — Z9889 Other specified postprocedural states: Secondary | ICD-10-CM

## 2020-01-11 DIAGNOSIS — R413 Other amnesia: Secondary | ICD-10-CM

## 2020-01-11 DIAGNOSIS — R4789 Other speech disturbances: Secondary | ICD-10-CM

## 2020-01-13 ENCOUNTER — Telehealth: Payer: Self-pay

## 2020-01-13 NOTE — Progress Notes (Signed)
Please call patient regarding the recent brain MRI: The brain scan showed a normal structure of the brain and moderate volume loss which we call atrophy. There were changes in the deeper structures of the brain, which we call white matter changes or microvascular changes. These were reported as significant or advanced for age in his case. These are tiny white spots, that occur with time and are seen in a variety of conditions, including with normal aging, chronic hypertension, chronic headaches, especially migraine HAs, chronic diabetes, chronic hyperlipidemia. These are not strokes and no mass or lesion were seen which is reassuring. Again, there were no acute findings, such as a stroke, or mass or blood products. No further action is required on this test at this time, other than re-enforcing the importance of good blood pressure control, good cholesterol control, good blood sugar control, and weight management. Please remind patient to keep any upcoming appointments or tests and to call us with any interim questions, concerns, problems or updates.  As discussed, we will proceed with sleep evaluation with a home sleep test. Thanks,  Star Age, MD, PhD

## 2020-01-13 NOTE — Telephone Encounter (Signed)
-----   Message from Star Age, MD sent at 01/13/2020 12:39 PM EST ----- Please call patient regarding the recent brain MRI: The brain scan showed a normal structure of the brain and moderate volume loss which we call atrophy. There were changes in the deeper structures of the brain, which we call white matter changes or microvascular changes. These were reported as significant or advanced for age in his case. These are tiny white spots, that occur with time and are seen in a variety of conditions, including with normal aging, chronic hypertension, chronic headaches, especially migraine HAs, chronic diabetes, chronic hyperlipidemia. These are not strokes and no mass or lesion were seen which is reassuring. Again, there were no acute findings, such as a stroke, or mass or blood products. No further action is required on this test at this time, other than re-enforcing the importance of good blood pressure control, good cholesterol control, good blood sugar control, and weight management. Please remind patient to keep any upcoming appointments or tests and to call us with any interim questions, concerns, problems or updates.  As discussed, we will proceed with sleep evaluation with a home sleep test. Thanks,  Star Age, MD, PhD

## 2020-01-13 NOTE — Telephone Encounter (Signed)
I called pt. I discussed his MRI results and recommendations. Pt will proceed with this HSt as scheduled. Pt verbalized understanding of results. Pt had no questions at this time but was encouraged to call back if questions arise.

## 2020-01-18 ENCOUNTER — Ambulatory Visit (INDEPENDENT_AMBULATORY_CARE_PROVIDER_SITE_OTHER): Payer: Medicare Other | Admitting: Neurology

## 2020-01-18 DIAGNOSIS — G4733 Obstructive sleep apnea (adult) (pediatric): Secondary | ICD-10-CM

## 2020-01-18 DIAGNOSIS — G4719 Other hypersomnia: Secondary | ICD-10-CM

## 2020-01-18 DIAGNOSIS — G478 Other sleep disorders: Secondary | ICD-10-CM

## 2020-01-18 DIAGNOSIS — R351 Nocturia: Secondary | ICD-10-CM

## 2020-01-25 ENCOUNTER — Telehealth: Payer: Self-pay

## 2020-01-25 ENCOUNTER — Encounter: Payer: Self-pay | Admitting: Neurology

## 2020-01-25 NOTE — Telephone Encounter (Signed)
I called pt. I advised pt that Dr. Rexene Alberts reviewed their sleep study results and found that pt has moderate osa. Dr. Rexene Alberts recommends that pt start an auto pap at home. I reviewed PAP compliance expectations with the pt. Pt is agreeable to starting an auto-PAP. I advised pt that an order will be sent to a DME, [facility], and [facility] will call the pt within about one week after they file with the pt's insurance. [facility] will show the pt how to use the machine, fit for masks, and troubleshoot the auto-PAP if needed. A follow up appt was made for insurance purposes with Dr. Rexene Alberts on 04/05/20 as previously scheduled. Pt verbalized understanding to arrive 15 minutes early and bring their auto-PAP. A letter with all of this information in it will be mailed to the pt as a reminder. I verified with the pt that the address we have on file is correct. Pt verbalized understanding of results. Pt had no questions at this time but was encouraged to call back if questions arise. I have sent the order to [facility] and have received confirmation that they have received the order.

## 2020-01-25 NOTE — Procedures (Signed)
   [facility]  HOME SLEEP TEST (Watch PAT)  STUDY DATE: 01/18/20  DOB: 19-Feb-1932  MRN: [id number]  ORDERING CLINICIAN: Star Age, MD, PhD   REFERRING CLINICIAN: Sueanne Margarita, DO   CLINICAL INFORMATION/HISTORY: 86 year old man with a history of hyperlipidemia, hearing loss, and melanoma, who reports memory problems and daytime somnolence.   BMI: 24.8 kg/m  FINDINGS:   Total Record Time (hours, min): 7 H 48 min  Total Sleep Time (hours, min):  6 H 58 min   Percent REM (%):    18.4 %   Calculated pAHI (per hour):     21.1     REM pAHI: 26.6     NREM pAHI: 19.8   Oxygen Saturation (%) Mean: 92 Minimum oxygen saturation (%):         87   O2 Saturation Range (%): 97-87 O2Saturation (minutes) <=88%: 2.9 min   Pulse Mean (bpm):    57 Pulse Range (85-40)   IMPRESSION: OSA (obstructive sleep apnea)   RECOMMENDATION:  This home sleep test demonstrates moderate obstructive sleep apnea with a total AHI of 21.1/hour and O2 nadir of 87%. Treatment with positive airway pressure is recommended. The patient will be advised to proceed with an autoPAP titration/trial at home for now. A full night titration study may be considered to optimize treatment settings, if needed down the road. Please note that untreated obstructive sleep apnea may carry additional perioperative morbidity. Patients with significant obstructive sleep apnea should receive perioperative PAP therapy and the surgeons and particularly the anesthesiologist should be informed of the diagnosis and the severity of the sleep disordered breathing. The patient should be cautioned not to drive, work at heights, or operate dangerous or heavy equipment when tired or sleepy. Review and reiteration of good sleep hygiene measures should be pursued with any patient. Other causes of the patient's symptoms, including circadian rhythm disturbances, an underlying mood disorder, medication effect and/or an underlying  medical problem cannot be ruled out based on this test. Clinical correlation is recommended. The patient and his referring provider will be notified of the test results. The patient will be seen in follow up in sleep clinic at [facility].  I certify that I have reviewed the raw data recording prior to the issuance of this report in accordance with the standards of the American Academy of Sleep Medicine (AASM).  INTERPRETING PHYSICIAN:    Star Age, MD, PhD  Board Certified in Neurology and Sleep Medicine [facility] [address] [place], [place] [address] [phone]

## 2020-01-25 NOTE — Addendum Note (Signed)
Addended by: Star Age on: 01/25/2020 08:54 AM   Modules accepted: Orders

## 2020-01-25 NOTE — Telephone Encounter (Signed)
-----   Message from Star Age, MD sent at 01/25/2020  8:54 AM EST ----- Patient referred by Dr. Donnetta Hutching for memory loss and previously, I had seen him for EDS. Last seen by me on 01/04/20, HST on 01/18/20.    Please call and notify the patient that the recent home sleep test showed obstructive sleep apnea in the moderate range. I recommend treatment for this in the form of autoPAP, which means, that we don't have to bring him in for a sleep study with CPAP, but will let him start using a so called autoPAP machine at home, through a DME company (of his choice, or as per insurance requirement). The DME representative will educate him on how to use the machine, how to put the mask on, etc. I have placed an order in the chart. Please send referral, talk to patient, send report to referring MD. We will need a FU in sleep clinic for 10 weeks post-PAP set up, please arrange that with me or one of our NPs. Thanks,   Star Age, MD, PhD [facility])

## 2020-01-25 NOTE — Progress Notes (Signed)
Patient referred by Dr. Donnetta Hutching for memory loss and previously, I had seen him for EDS. Last seen by me on 01/04/20, HST on 01/18/20.    Please call and notify the patient that the recent home sleep test showed obstructive sleep apnea in the moderate range. I recommend treatment for this in the form of autoPAP, which means, that we don't have to bring him in for a sleep study with CPAP, but will let him start using a so called autoPAP machine at home, through a DME company (of his choice, or as per insurance requirement). The DME representative will educate him on how to use the machine, how to put the mask on, etc. I have placed an order in the chart. Please send referral, talk to patient, send report to referring Roger Benjamin. We will need a FU in sleep clinic for 10 weeks post-PAP set up, please arrange that with me or one of our NPs. Thanks,   Roger Age, Roger Benjamin, Roger Benjamin [facility])

## 2020-04-05 ENCOUNTER — Ambulatory Visit: Payer: Medicare Other | Admitting: Neurology

## 2020-05-03 ENCOUNTER — Encounter: Payer: Self-pay | Admitting: Neurology

## 2020-05-03 ENCOUNTER — Ambulatory Visit (INDEPENDENT_AMBULATORY_CARE_PROVIDER_SITE_OTHER): Payer: Medicare Other | Admitting: Neurology

## 2020-05-03 VITALS — BP 134/86 | HR 63 | Ht 73.0 in | Wt 187.3 lb

## 2020-05-03 DIAGNOSIS — F439 Reaction to severe stress, unspecified: Secondary | ICD-10-CM | POA: Diagnosis not present

## 2020-05-03 DIAGNOSIS — G4733 Obstructive sleep apnea (adult) (pediatric): Secondary | ICD-10-CM

## 2020-05-03 DIAGNOSIS — R413 Other amnesia: Secondary | ICD-10-CM | POA: Diagnosis not present
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
## 2020-05-03 NOTE — Patient Instructions (Signed)
It was nice to see you again today.  As discussed, we will proceed with the AutoPap machine treatment at home for your obstructive sleep apnea when you are ready.  Please call us when you are ready to pursue the AutoPap machine at home, we will send the order to a local DME (durable medical equipment) company.  We will have you follow-up within 30 to 90 days after starting treatment.  Please call us so we can set up the appointment accordingly.  Hopefully you will notice improvement in your memory function over time, also in your daytime energy and sleep quality with treatment of your moderate obstructive sleep apnea, as indicated by her home sleep test in November 2021.

## 2020-08-30 ENCOUNTER — Other Ambulatory Visit: Payer: Self-pay

## 2020-08-30 ENCOUNTER — Ambulatory Visit (INDEPENDENT_AMBULATORY_CARE_PROVIDER_SITE_OTHER): Payer: Medicare Other | Admitting: Physician Assistant

## 2020-08-30 ENCOUNTER — Encounter: Payer: Self-pay | Admitting: Physician Assistant

## 2020-08-30 ENCOUNTER — Ambulatory Visit (HOSPITAL_COMMUNITY)
Admission: RE | Admit: 2020-08-30 | Discharge: 2020-08-30 | Disposition: A | Discharge status: Home / Self Care | Payer: Medicare Other | Source: Ambulatory Visit | Attending: Physician Assistant | Admitting: Physician Assistant

## 2020-08-30 VITALS — BP 133/75 | HR 64 | Temp 98.4°F | Resp 16 | Wt 186.5 lb

## 2020-08-30 DIAGNOSIS — Z9889 Other specified postprocedural states: Secondary | ICD-10-CM | POA: Diagnosis not present

## 2020-08-30 DIAGNOSIS — I6523 Occlusion and stenosis of bilateral carotid arteries: Secondary | ICD-10-CM | POA: Diagnosis not present

## 2020-08-30 NOTE — Progress Notes (Signed)
Carotid Artery Follow-Up   VASCULAR SURGERY ASSESSMENT & PLAN:   Roger Benjamin is a 87 y.o. male is s/p left CEA on 09/23/2019 by Dr. Donnetta Hutching for asymptomatic high grade left ICA stenosis.  Bilateral carotid artery stenosis: The patient has no symptoms referable to carotid artery stenosis.  Duplex examination today is stable as compared to 9 months ago.  We reviewed the signs and symptoms of stroke/TIA and advised the patient to call EMS should these occur.   Continue optimal medical management of hypertension and follow-up with primary care physician. Right subclavian stenosis with retrograde vertebral flow. No clear evidence of vertebrobasilar insufficiency. He does have balance issues he associated with lingering weakness from Covid infection in March. Advised him to follow-up with neurology if this does not improve, Encouraged continued complete smoking cessation. Continue the following medications: aspirin. He desires to resume this QOD. Follow-up in 1 year with carotid duplex ultrasound.  SUBJECTIVE:   The patient denies monocular blindness, slurred speech, facial drooping, extremity weakness or numbness. He denies upper extremity fatigue, dizziness or syncope or near-syncope. He stopped taking aspirin due to easy bleeding and bruisability. He [facility]X week at the [facility].  PHYSICAL EXAM:   Vitals:   08/30/20 1029  BP: (!) 151/72  Pulse: 64  Resp: 16  Temp: 98.4 F (36.9 C)  SpO2: 95%  Weight: 186 lb 8 oz (84.6 kg)    General appearance: Well-developed, well-nourished in no apparent distress Neurologic: Alert and oriented x4, face symmetric, speech fluent, 5 out of 5 bilateral upper extremity grip strength, triceps and biceps strength Cardiovascular: Heart rate and rhythm are regular.   Bilateral carotid bruits. Pulse exam: 1+ right and 2+ left symmetrical radial pulses. 2+ bilateral brachial pulses Respirations: Nonlabored Abdomen: No palpable pulsatile  mass   NON-INVASIVE VASCULAR STUDIES   08/30/2020 Right Carotid: Velocities in the right ICA are consistent with a 1-39%  stenosis.   Left Carotid: There is no evidence of stenosis in the left ICA. Patent  carotid endarterectomy site. The ECA appears >50% stenosed.   Vertebrals:  Left vertebral artery demonstrates antegrade flow. Right  vertebral artery demonstrates retrograde flow.   Subclavians: Right subclavian artery was stenotic. Normal flow  hemodynamics were seen in the left subclavian artery.   PROBLEM LIST:    The patient's past medical history, past surgical history, family history, social history, allergy list and medication list are reviewed. He was evaluated by Dr. Rexene Alberts of [place] on 05/03/2020  CURRENT MEDS:    Current Outpatient Medications:    metoprolol succinate (TOPROL-XL) 25 MG 24 hr tablet, Take 25 mg by mouth daily., Disp: , Rfl:    Multiple Vitamins-Minerals (MULTIVITAMIN WITH MINERALS) tablet, Take 1 tablet by mouth daily., Disp: , Rfl:    aspirin EC 81 MG tablet, Take 81 mg by mouth every other day. Swallow whole. (Patient not taking: Reported on 08/30/2020), Disp: , Rfl:    REVIEW OF SYSTEMS:   [X]  denotes positive finding, [ ]  denotes negative finding Cardiac  Comments:  Chest pain or chest pressure:    Shortness of breath upon exertion:    Short of breath when lying flat:    Irregular heart rhythm:        Vascular    Pain in calf, thigh, or hip brought on by ambulation:    Pain in feet at night that wakes you up from your sleep:     Blood clot in your veins:    Leg swelling:  Pulmonary    Oxygen at home:    Productive cough:     Wheezing:         Neurologic    Sudden weakness in arms or legs:     Sudden numbness in arms or legs:     Sudden onset of difficulty speaking or slurred speech:    Temporary loss of vision in one eye:     Problems with dizziness:         Gastrointestinal    Blood in stool:     Vomited blood:          Genitourinary    Burning when urinating:     Blood in urine:        Psychiatric    Major depression:         Hematologic    Bleeding problems:    Problems with blood clotting too easily:        Skin    Rashes or ulcers:        Constitutional    Fever or chills:     Barbie Banner, PA-C  Office: [phone] 08/30/2020

## 2021-01-30 ENCOUNTER — Other Ambulatory Visit: Payer: Self-pay | Admitting: Internal Medicine

## 2021-01-30 ENCOUNTER — Other Ambulatory Visit (HOSPITAL_COMMUNITY): Payer: Self-pay | Admitting: Radiology

## 2021-01-30 DIAGNOSIS — R053 Chronic cough: Secondary | ICD-10-CM

## 2021-02-23 ENCOUNTER — Ambulatory Visit
Admission: RE | Admit: 2021-02-23 | Discharge: 2021-02-23 | Disposition: A | Discharge status: Home / Self Care | Payer: Medicare Other | Source: Ambulatory Visit | Attending: Internal Medicine | Admitting: Internal Medicine

## 2021-02-23 DIAGNOSIS — R053 Chronic cough: Secondary | ICD-10-CM

## 2021-03-17 ENCOUNTER — Other Ambulatory Visit: Payer: Self-pay | Admitting: Internal Medicine

## 2021-03-17 LAB — SARS CORONAVIRUS 2 (TAT 6-24 HRS): SARS Coronavirus 2: NEGATIVE

## 2021-03-21 ENCOUNTER — Other Ambulatory Visit: Payer: Self-pay

## 2021-03-21 ENCOUNTER — Ambulatory Visit (HOSPITAL_COMMUNITY)
Admission: RE | Admit: 2021-03-21 | Discharge: 2021-03-21 | Disposition: A | Discharge status: Home / Self Care | Payer: Medicare Other | Source: Ambulatory Visit | Attending: Internal Medicine | Admitting: Internal Medicine

## 2021-03-21 DIAGNOSIS — R053 Chronic cough: Secondary | ICD-10-CM | POA: Insufficient documentation

## 2021-03-21 LAB — PULMONARY FUNCTION TEST
DL/VA % pred: 66 %
DL/VA: 2.49 ml/min/mmHg/L
DLCO unc % pred: 51 %
DLCO unc: 13.28 ml/min/mmHg
FEF 25-75 Post: 1.42 L/sec
FEF 25-75 Pre: 0.75 L/sec
FEF2575-%Change-Post: 88 %
FEF2575-%Pred-Post: 74 %
FEF2575-%Pred-Pre: 39 %
FEV1-%Change-Post: 16 %
FEV1-%Pred-Post: 80 %
FEV1-%Pred-Pre: 69 %
FEV1-Post: 2.38 L
FEV1-Pre: 2.05 L
FEV1FVC-%Change-Post: 9 %
FEV1FVC-%Pred-Pre: 81 %
FEV6-%Change-Post: 11 %
FEV6-%Pred-Post: 93 %
FEV6-%Pred-Pre: 83 %
FEV6-Post: 3.67 L
FEV6-Pre: 3.3 L
FEV6FVC-%Change-Post: 4 %
FEV6FVC-%Pred-Post: 103 %
FEV6FVC-%Pred-Pre: 98 %
FVC-%Change-Post: 6 %
FVC-%Pred-Post: 90 %
FVC-%Pred-Pre: 84 %
FVC-Post: 3.82 L
FVC-Pre: 3.59 L
Post FEV1/FVC ratio: 62 %
Post FEV6/FVC ratio: 96 %
Pre FEV1/FVC ratio: 57 %
Pre FEV6/FVC Ratio: 92 %
RV % pred: 133 %
RV: 3.96 L
TLC % pred: 97 %
TLC: 7.52 L

## 2021-03-21 MED ORDER — ALBUTEROL SULFATE (2.5 MG/3ML) 0.083% IN NEBU
2.5000 mg | INHALATION_SOLUTION | Freq: Once | RESPIRATORY_TRACT | Status: AC
  Administered 2021-03-21: 09:00:00 2.5 mg via RESPIRATORY_TRACT

## 2021-04-13 ENCOUNTER — Ambulatory Visit
Admission: EM | Admit: 2021-04-13 | Discharge: 2021-04-13 | Disposition: A | Discharge status: Home / Self Care | Payer: Medicare Other | Attending: Internal Medicine | Admitting: Internal Medicine

## 2021-04-13 ENCOUNTER — Ambulatory Visit (INDEPENDENT_AMBULATORY_CARE_PROVIDER_SITE_OTHER): Payer: Medicare Other

## 2021-04-13 ENCOUNTER — Encounter: Payer: Self-pay | Admitting: Emergency Medicine

## 2021-04-13 DIAGNOSIS — L03115 Cellulitis of right lower limb: Secondary | ICD-10-CM

## 2021-04-13 DIAGNOSIS — S80211A Abrasion, right knee, initial encounter: Secondary | ICD-10-CM

## 2021-04-13 DIAGNOSIS — M25561 Pain in right knee: Secondary | ICD-10-CM | POA: Diagnosis not present

## 2021-04-13 DIAGNOSIS — W19XXXA Unspecified fall, initial encounter: Secondary | ICD-10-CM | POA: Diagnosis not present

## 2021-04-13 MED ORDER — CEPHALEXIN 500 MG PO CAPS: 500.0000 mg | ORAL_CAPSULE | Freq: Four times a day (QID) | ORAL | 0 refills | Status: DC

## 2021-04-13 NOTE — ED Provider Notes (Signed)
[facility]    CSN: [id number] Arrival date & time: 04/13/21  6270      History   Chief Complaint Chief Complaint  Patient presents with   Knee Pain    HPI Roger Benjamin is a 88 y.o. male.   Patient presents for further evaluation of right knee pain after a fall that occurred approximately 10 days ago.  Patient states that he fell down and landed on both of his knees.  Denies hitting head or losing consciousness during exam.  Knee pain did not start until yesterday.  He reports also associated swelling with severe pain.  Denies taking blood thinners.  Denies any numbness or tingling.  Patient has taken Tylenol for pain with minimal improvement.  Patient is able to bear weight.  Denies fevers, body aches, chills.   Knee Pain  Past Medical History:  Diagnosis Date   Carotid artery stenosis    Hypertension    Unsure is on Metoprolol   Skin cancer     Patient Active Problem List   Diagnosis Date Noted   Left carotid artery stenosis 09/23/2019    Past Surgical History:  Procedure Laterality Date   ENDARTERECTOMY Left 09/23/2019   Procedure: ENDARTERECTOMY LEFT CAROTID with PATCH ANGIOPLASTY;  Surgeon: Rosetta Posner, MD;  Location: [facility] OR;  Service: Vascular;  Laterality: Left;   FRACTURE SURGERY Left    leg   SKIN CANCER EXCISION     TONSILLECTOMY     as a child       Home Medications    Prior to Admission medications   Medication Sig Start Date End Date Taking? Authorizing Provider  cephALEXin (KEFLEX) 500 MG capsule Take 1 capsule (500 mg total) by mouth 4 (four) times daily. 04/13/21  Yes Mackinze Criado, Hildred Alamin E, FNP  metoprolol succinate (TOPROL-XL) 25 MG 24 hr tablet Take 25 mg by mouth daily.   Yes [provider]  Multiple Vitamins-Minerals (MULTIVITAMIN WITH MINERALS) tablet Take 1 tablet by mouth daily.   Yes [provider]  aspirin EC 81 MG tablet Take 81 mg by mouth every other day. Swallow whole. Patient not taking: Reported on  08/30/2020    [provider]    Family History History reviewed. No pertinent family history.  Social History Social History   Tobacco Use   Smoking status: Former    Packs/day: 3.00    Years: 30.00    Pack years: 90.00    Types: Cigarettes    Start date: 62    Quit date: 02/27/1980    Years since quitting: 41.1   Smokeless tobacco: Never  Vaping Use   Vaping Use: Never used  Substance Use Topics   Alcohol use: Yes    Alcohol/week: 7.0 standard drinks    Types: 7 Shots of liquor per week    Comment: 1 drink daily    Drug use: Never     Allergies   Tape   Review of Systems Review of Systems Per HPI  Physical Exam Triage Vital Signs ED Triage Vitals  Enc Vitals Group     BP 04/13/21 0939 133/79     Pulse Rate 04/13/21 0939 75     Resp 04/13/21 0939 18     Temp 04/13/21 0939 98.8 F (37.1 C)     Temp Source 04/13/21 0939 Oral     SpO2 04/13/21 0939 95 %     Weight 04/13/21 0941 186 lb 8.2 oz (84.6 kg)     Height 04/13/21  0941 6\' 1"  (1.854 m)     Head Circumference --      Peak Flow --      Pain Score 04/13/21 0941 6     Pain Loc --      Pain Edu? --      Excl. in [place]? --    No data found.  Updated Vital Signs BP 133/79 (BP Location: Right Arm)    Pulse 75    Temp 98.8 F (37.1 C) (Oral)    Resp 18    Ht 6\' 1"  (1.854 m)    Wt 186 lb 8.2 oz (84.6 kg)    SpO2 95%    BMI 24.61 kg/m   Visual Acuity Right Eye Distance:   Left Eye Distance:   Bilateral Distance:    Right Eye Near:   Left Eye Near:    Bilateral Near:     Physical Exam Constitutional:      General: He is not in acute distress.    Appearance: Normal appearance. He is not toxic-appearing or diaphoretic.  HENT:     Head: Normocephalic and atraumatic.  Eyes:     Extraocular Movements: Extraocular movements intact.     Conjunctiva/sclera: Conjunctivae normal.  Pulmonary:     Effort: Pulmonary effort is normal.  Musculoskeletal:     Right knee: Swelling and erythema present.  No deformity or crepitus. Tenderness present. No LCL laxity, MCL laxity, ACL laxity or PCL laxity. Normal alignment, normal meniscus and normal patellar mobility. Normal pulse.     Left knee: Normal.     Comments: Patient has approximately 2.5 cm x 1 cm abrasion to right knee directly over patella.  There is surrounding erythema and swelling noted throughout entirety of anterior knee as well.  No obvious bleeding noted.  No bruising noted.  Patient has full range of motion of the knee but with pain.  Neurovascular intact.  Neurological:     General: No focal deficit present.     Mental Status: He is alert and oriented to person, place, and time. Mental status is at baseline.  Psychiatric:        Mood and Affect: Mood normal.        Behavior: Behavior normal.        Thought Content: Thought content normal.        Judgment: Judgment normal.     UC Treatments / Results  Labs (all labs ordered are listed, but only abnormal results are displayed) Labs Reviewed - No data to display  EKG   Radiology DG Knee Complete 4 Views Right  Result Date: 04/13/2021 CLINICAL DATA:  Right knee pain and swelling after fall 10 days ago. EXAM: RIGHT KNEE - COMPLETE 4+ VIEW COMPARISON:  None. FINDINGS: No evidence of fracture, dislocation, or joint effusion. No evidence of arthropathy or other focal bone abnormality. Prepatellar soft tissue swelling is noted which may be traumatic in etiology. IMPRESSION: Prepatellar soft tissue swelling is noted which most likely is traumatic in etiology. No other abnormality seen in the right knee. Electronically Signed   By: Marijo Conception M.D.   On: 04/13/2021 10:25    Procedures Procedures (including critical care time)  Medications Ordered in UC Medications - No data to display  Initial Impression / Assessment and Plan / UC Course  I have reviewed the triage vital signs and the nursing notes.  Pertinent labs & imaging results that were available during my care of  the patient were reviewed by me and  considered in my medical decision making (see chart for details).     Right knee x-ray was negative for any acute bony abnormality.  Patient does have infection from abrasion to right knee.  Will prescribe cephalexin antibiotic.  No concern for osteomyelitis at this time.  Patient to monitor very closely and change dressings daily.  Advised patient to follow-up with primary care today to schedule appointment for reevaluation to ensure adequate healing.  Patient voiced understanding.  Discussed strict return and ER precautions.  Patient verbalized understanding and was agreeable with plan. Final Clinical Impressions(s) / UC Diagnoses   Final diagnoses:  Fall, initial encounter  Cellulitis of knee, right  Abrasion, right knee, initial encounter  Acute pain of right knee     Discharge Instructions      It appears that you have an infection of your right knee.  You have been prescribed an antibiotic to help treat this.  Please apply dressings daily and keep covered.  Follow-up with primary care in the next few days to ensure evaluation of adequate healing.  Go to the hospital if symptoms worsen or if you develop fever, chills, body aches.  Your x-ray was normal.     ED Prescriptions     Medication Sig Dispense Auth. Provider   cephALEXin (KEFLEX) 500 MG capsule Take 1 capsule (500 mg total) by mouth 4 (four) times daily. 28 capsule [place], Michele Rockers, [place]      PDMP not reviewed this encounter.   Teodora Medici, [place] 04/13/21 1051

## 2021-04-13 NOTE — Discharge Instructions (Signed)
It appears that you have an infection of your right knee.  You have been prescribed an antibiotic to help treat this.  Please apply dressings daily and keep covered.  Follow-up with primary care in the next few days to ensure evaluation of adequate healing.  Go to the hospital if symptoms worsen or if you develop fever, chills, body aches.  Your x-ray was normal.

## 2021-04-13 NOTE — ED Triage Notes (Signed)
Patient states that he fell 10 days ago, then yesterday his right knee started swelling and having severe pain, unable to get any sleep last night.  Patient has taken Tylenol for the pain.

## 2021-04-21 ENCOUNTER — Other Ambulatory Visit: Payer: Self-pay

## 2021-04-21 ENCOUNTER — Ambulatory Visit
Admission: EM | Admit: 2021-04-21 | Discharge: 2021-04-21 | Disposition: A | Discharge status: Home / Self Care | Payer: Medicare Other | Attending: Internal Medicine | Admitting: Internal Medicine

## 2021-04-21 DIAGNOSIS — L03115 Cellulitis of right lower limb: Secondary | ICD-10-CM | POA: Diagnosis not present

## 2021-04-21 MED ORDER — CEFTRIAXONE SODIUM 1 G IJ SOLR
1.0000 g | Freq: Once | INTRAMUSCULAR | Status: AC
  Administered 2021-04-21: 1 g via INTRAMUSCULAR

## 2021-04-21 MED ORDER — SULFAMETHOXAZOLE-TRIMETHOPRIM 800-160 MG PO TABS: 1.0000 | ORAL_TABLET | Freq: Two times a day (BID) | ORAL | 0 refills | Status: AC
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
## 2021-04-21 NOTE — ED Triage Notes (Signed)
Pt c/o right knee pain. States he had a fall at home and he came to this clinic about a week ago. States "the infection is still not completely gone, I have swelling from my knee down to my foot."

## 2021-04-21 NOTE — Discharge Instructions (Signed)
An antibiotic injection was administered today for you in urgent care.  Antibiotic pills have been sent to the pharmacy for you to start taking today as well.  Please follow-up with primary care today as soon as possible for further evaluation and management and to ensure adequate healing.

## 2021-08-11 ENCOUNTER — Other Ambulatory Visit: Payer: Self-pay | Admitting: Internal Medicine

## 2021-08-11 ENCOUNTER — Other Ambulatory Visit (HOSPITAL_COMMUNITY): Payer: Self-pay | Admitting: Internal Medicine

## 2021-08-11 DIAGNOSIS — R42 Dizziness and giddiness: Secondary | ICD-10-CM

## 2021-08-11 DIAGNOSIS — I6523 Occlusion and stenosis of bilateral carotid arteries: Secondary | ICD-10-CM

## 2021-08-14 ENCOUNTER — Ambulatory Visit (HOSPITAL_COMMUNITY)
Admission: RE | Admit: 2021-08-14 | Discharge: 2021-08-14 | Disposition: A | Discharge status: Home / Self Care | Payer: Medicare Other | Source: Ambulatory Visit | Attending: Internal Medicine | Admitting: Internal Medicine

## 2021-08-14 ENCOUNTER — Other Ambulatory Visit (HOSPITAL_COMMUNITY): Payer: Self-pay | Admitting: Internal Medicine

## 2021-08-14 DIAGNOSIS — R42 Dizziness and giddiness: Secondary | ICD-10-CM | POA: Diagnosis present

## 2021-08-14 DIAGNOSIS — I6523 Occlusion and stenosis of bilateral carotid arteries: Secondary | ICD-10-CM | POA: Diagnosis present

## 2021-08-14 MED ORDER — IOHEXOL 350 MG/ML SOLN
75.0000 mL | Freq: Once | INTRAVENOUS | Status: AC | PRN
  Administered 2021-08-14: 75 mL via INTRAVENOUS

## 2021-08-14 MED ORDER — SODIUM CHLORIDE (PF) 0.9 % IJ SOLN
INTRAMUSCULAR | Status: AC
  Filled 2021-08-14: qty 50

## 2021-09-28 ENCOUNTER — Encounter (HOSPITAL_COMMUNITY): Payer: Medicare Other

## 2021-09-28 ENCOUNTER — Ambulatory Visit: Payer: Medicare Other

## 2021-10-10 NOTE — Progress Notes (Unsigned)
[facility] [place] HISTORY AND PHYSICAL   History of Present Illness:  Patient is a 88 y.o. year old male who presents for evaluation of asymptomatic carotid stenosis.  He is s/p left CEA on 09/23/2019 by Dr. Donnetta Hutching for asymptomatic high grade left ICA stenosis.  Right Carotid: Velocities in the right ICA are consistent with a 1-39% stenosis.  He denise amaurosis, aphasia, and weakness.  He was having bouts of dizziness which improved with hydration.  He is medically managed on ASA, Statin weekly and BB.        Past Medical History:  Diagnosis Date   Carotid artery stenosis    Hypertension    Unsure is on Metoprolol   Skin cancer     Past Surgical History:  Procedure Laterality Date   ENDARTERECTOMY Left 09/23/2019   Procedure: ENDARTERECTOMY LEFT CAROTID with PATCH ANGIOPLASTY;  Surgeon: Rosetta Posner, MD;  Location: [facility] OR;  Service: Vascular;  Laterality: Left;   FRACTURE SURGERY Left    leg   SKIN CANCER EXCISION     TONSILLECTOMY     as a child    ROS:   General:  No weight loss, Fever, chills  HEENT: No recent headaches, no nasal bleeding, no visual changes, no sore throat  Neurologic: No dizziness, blackouts, seizures. No recent symptoms of stroke or mini- stroke. No recent episodes of slurred speech, or temporary blindness.  Cardiac: No recent episodes of chest pain/pressure, no shortness of breath at rest.  No shortness of breath with exertion.  Denies history of atrial fibrillation or irregular heartbeat  Vascular: No history of rest pain in feet.  No history of claudication.  No history of non-healing ulcer, No history of DVT   Pulmonary: No home oxygen, no productive cough, no hemoptysis,  No asthma or wheezing  Musculoskeletal:  '[ ]'$  Arthritis, '[ ]'$  Low back pain,  '[ ]'$  Joint pain  Hematologic:No history of hypercoagulable state.  No history of easy bleeding.  No history of anemia  Gastrointestinal: No hematochezia or melena,  No  gastroesophageal reflux, no trouble swallowing  Urinary: '[ ]'$  chronic Kidney disease, '[ ]'$  on HD - '[ ]'$  MWF or '[ ]'$  TTHS, '[ ]'$  Burning with urination, '[ ]'$  Frequent urination, '[ ]'$  Difficulty urinating;   Skin: No rashes  Psychological: No history of anxiety,  No history of depression  Social History Social History   Tobacco Use   Smoking status: Former    Packs/day: 3.00    Years: 30.00    Total pack years: 90.00    Types: Cigarettes    Start date: 98    Quit date: 02/27/1980    Years since quitting: 41.6   Smokeless tobacco: Never  Vaping Use   Vaping Use: Never used  Substance Use Topics   Alcohol use: Yes    Alcohol/week: 7.0 standard drinks of alcohol    Types: 7 Shots of liquor per week    Comment: 1 drink daily    Drug use: Never    Family History History reviewed. No pertinent family history.  Allergies  Allergies  Allergen Reactions   Sulfa Antibiotics     Other reaction(s): stomach upset   Tape     Needs paper tape     Current Outpatient Medications  Medication Sig Dispense Refill   aspirin EC 81 MG tablet Take 81 mg by mouth every other day. Swallow whole.     finasteride (PROSCAR) 5 MG tablet Take 5 mg by  mouth daily.     metoprolol succinate (TOPROL-XL) 25 MG 24 hr tablet Take 12.5 mg by mouth daily.     Multiple Vitamins-Minerals (MULTIVITAMIN WITH MINERALS) tablet Take 1 tablet by mouth daily.     rosuvastatin (CRESTOR) 5 MG tablet Take 5 mg by mouth once a week.     No current facility-administered medications for this visit.    Physical Examination  Vitals:   10/12/21 0821 10/12/21 0824  BP: (!) 150/71 126/73  Pulse: (!) 57   Resp: 20   Temp: 98.4 F (36.9 C)   SpO2: 99%   Weight: 184 lb (83.5 kg)   Height: '6\' 1"'$  (1.854 m)     Body mass index is 24.28 kg/m.  General:  Alert and oriented, no acute distress HEENT: Normal Neck: No bruit or JVD Pulmonary: Clear to auscultation bilaterally Cardiac: Regular Rate and Rhythm without  murmur Abdomen: Soft, non-tender, non-distended, no mass, no scars Skin: No rash Extremity Pulses:  2+ radial pulses bilaterally Musculoskeletal: No deformity or edema  Neurologic: Upper and lower extremity motor 5/5 and symmetric  DATA:  EXAM: CT ANGIOGRAPHY HEAD AND NECK   TECHNIQUE: Multidetector CT imaging of the head and neck was performed using the standard protocol during bolus administration of intravenous contrast. Multiplanar CT image reconstructions and MIPs were obtained to evaluate the vascular anatomy. Carotid stenosis measurements (when applicable) are obtained utilizing NASCET criteria, using the distal internal carotid diameter as the denominator.   RADIATION DOSE REDUCTION: This exam was performed according to the departmental dose-optimization program which includes automated exposure control, adjustment of the mA and/or kV according to patient size and/or use of iterative reconstruction technique.   CONTRAST:  47m OMNIPAQUE IOHEXOL 350 MG/ML SOLN   COMPARISON:  July 2021 CTA neck   FINDINGS: CT HEAD   Brain: There is no acute intracranial hemorrhage, mass effect, or edema. Gray-white differentiation is preserved. There is no extra-axial fluid collection. Prominence of the ventricles and sulci reflects generalized parenchymal volume loss. Confluent areas of low-density in the supratentorial white matter are nonspecific but may reflect moderate chronic microvascular ischemic changes. There is a chronic small vessel infarct of the left caudate.   Vascular: Better evaluated on CTA portion.   Skull: Calvarium is unremarkable.   Sinuses/Orbits: No acute finding.   Other: None.   Review of the MIP images confirms the above findings   CTA NECK   Aortic arch: Plaque along the arch and patent great vessel origins. Plaque at the left greater than right subclavian origins. Stenosis has progressed.   Right carotid system: Patent. Primarily calcified  plaque along the common carotid with less than 50% stenosis. Primarily calcified plaque along the proximal internal carotid with less than 50% stenosis.   Left carotid system: Patent. Mixed plaque along the common carotid with less than 50% stenosis. Evidence of endarterectomy. No stenosis at the ICA origin.   Vertebral arteries: Plaque at the right vertebral origin with mild stenosis. The vessel remains patent in the neck unlike on the prior study. Left vertebral artery is patent.   Skeleton: Similar degenerative changes of the cervical spine.   Other neck: Unremarkable.   Upper chest: Emphysema.   Review of the MIP images confirms the above findings   CTA HEAD   Anterior circulation: Intracranial internal carotid arteries are patent with plaque but no significant stenosis. Anterior and middle cerebral arteries are patent. Mild atherosclerotic irregularity.   Posterior circulation: Intracranial vertebral arteries are patent. Basilar artery is patent.  Right posterior communicating artery is present. Posterior cerebral arteries are patent. Mild atherosclerotic irregularity.   Venous sinuses: Patent as allowed by contrast bolus timing.   Review of the MIP images confirms the above findings   IMPRESSION: No acute intracranial abnormality. Chronic microvascular ischemic changes.   Plaque at the left greater than right subclavian artery origins with progression of stenosis since 2021. Improved flow within the extracranial right vertebral artery.   Plaque along the right common and internal carotid arteries with less than 50% stenosis. Post left endarterectomy without ICA stenosis.   ASSESSMENT/PLAN:  Carotid stenosis s/p left ICA stenosis by Dr. Donnetta Hutching on 09/23/19.   He has had a recent history of dizziness that has improved with hydration.  He denies amaurosis, weakness, or aphasia.    The CTA was reviewed with Dr. Trula Slade which shows asymptomatic  right subclavian  origin stenosis.  The left ICA demonstrates no recurrent stenosis.  ight common and internal carotid arteries with less than 50% stenosis.  He will f/u for repeat carotid studies in 6 months.  If he develops symptoms of stroke he will call 911.        Roxy Horseman PA-C [facility] [place] Office: [phone]   MD in clinic [place]

## 2021-10-12 ENCOUNTER — Ambulatory Visit (INDEPENDENT_AMBULATORY_CARE_PROVIDER_SITE_OTHER): Payer: Medicare Other | Admitting: Physician Assistant

## 2021-10-12 VITALS — BP 126/73 | HR 57 | Temp 98.4°F | Resp 20 | Ht 73.0 in | Wt 184.0 lb

## 2021-10-12 DIAGNOSIS — I6523 Occlusion and stenosis of bilateral carotid arteries: Secondary | ICD-10-CM | POA: Diagnosis not present

## 2021-10-17 ENCOUNTER — Other Ambulatory Visit: Payer: Self-pay

## 2021-10-17 DIAGNOSIS — I6529 Occlusion and stenosis of unspecified carotid artery: Secondary | ICD-10-CM

## 2021-10-17 DIAGNOSIS — I6523 Occlusion and stenosis of bilateral carotid arteries: Secondary | ICD-10-CM

## 2021-11-08 IMAGING — CT CT ANGIO NECK
3 of 7 series · 10 of 36 positions shown · IV contrast (OMNI 350)
Comparison: None.

CLINICAL DATA: Carotid stenosis

EXAM:
CT ANGIOGRAPHY NECK
TECHNIQUE: Multidetector CT imaging of the neck was performed using the
standard protocol during bolus administration of intravenous
contrast. Multiplanar CT image reconstructions and MIPs were
obtained to evaluate the vascular anatomy. Carotid stenosis
measurements (when applicable) are obtained utilizing NASCET
criteria, using the distal internal carotid diameter as the
denominator.
CONTRAST:  50mL OMNIPAQUE IOHEXOL 350 MG/ML SOLN

[Series 5: cta neck · axial · 0.45mm/px · z∈[-284,-192]mm · 2 of 140 slices shown]
[im 47/140  soft-tissue]
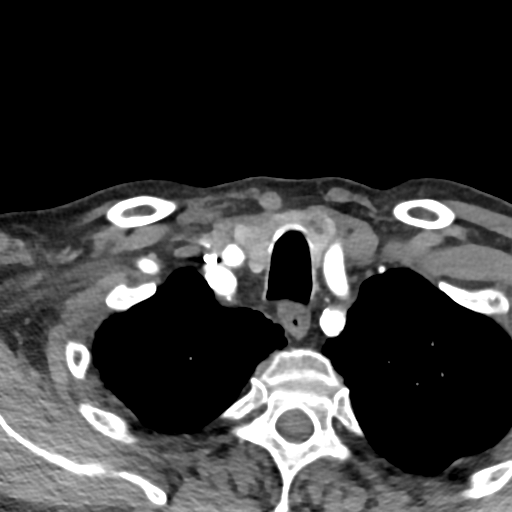
[im 93/140  soft-tissue]
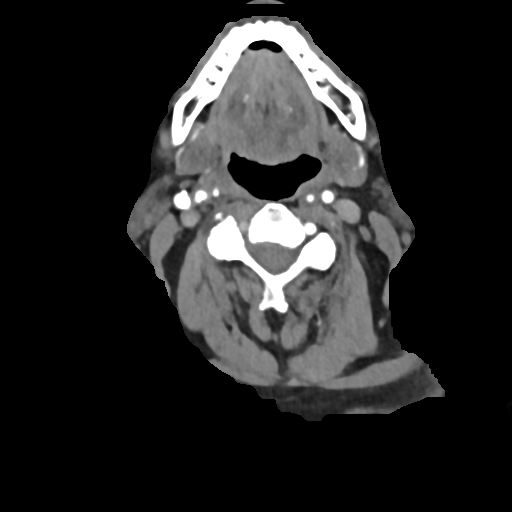

[Series 7: cta neck axial · axial · 0.39mm/px · z∈[-354,-161]mm · 6 of 278 slices shown]
[im 40/278  soft-tissue]
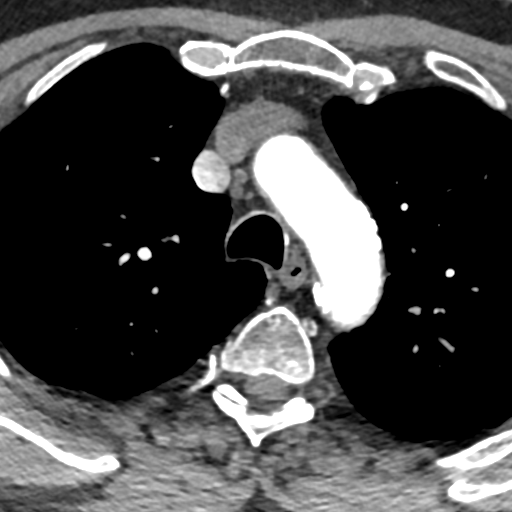
[im 80/278  bone]
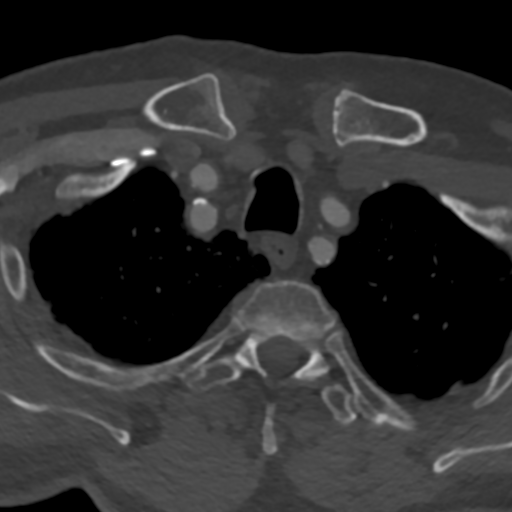
[im 119/278  soft-tissue]
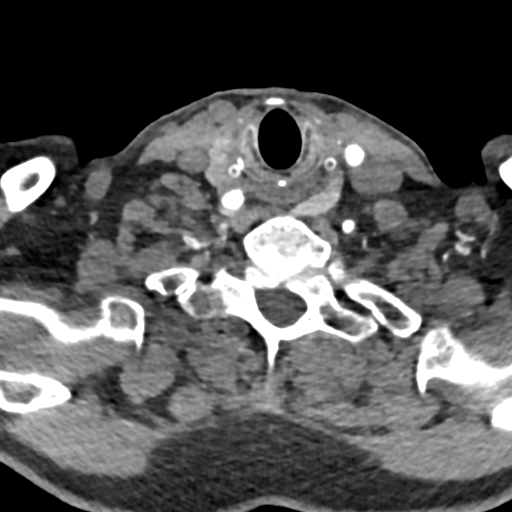
[im 159/278  bone]
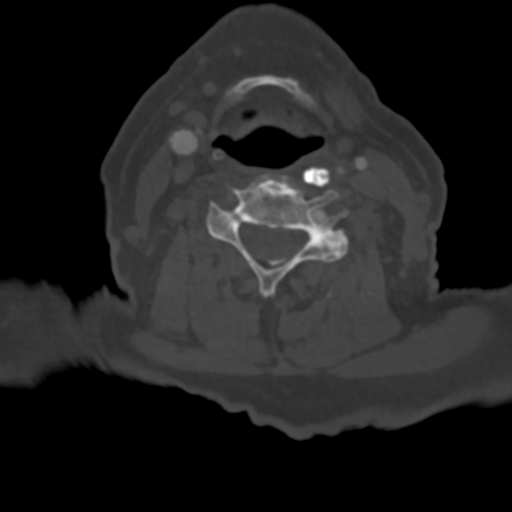
[im 198/278  soft-tissue]
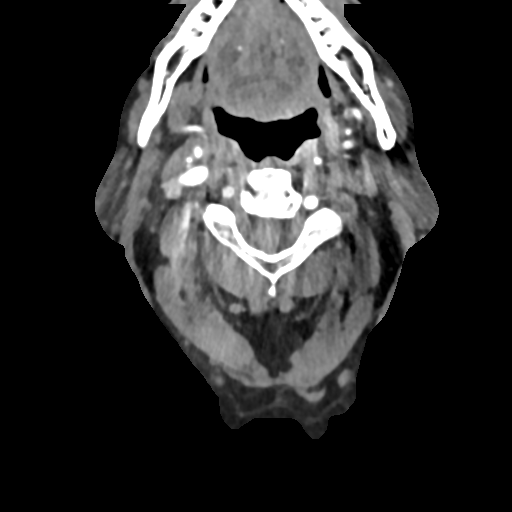
[im 238/278  bone]
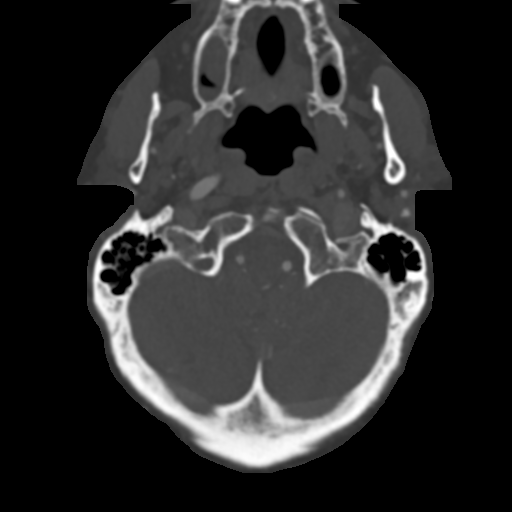

[Series 9: cta neck sagittal · sagittal · 0.44mm/px · 2 of 201 slices shown]
[im 69/201  soft-tissue]
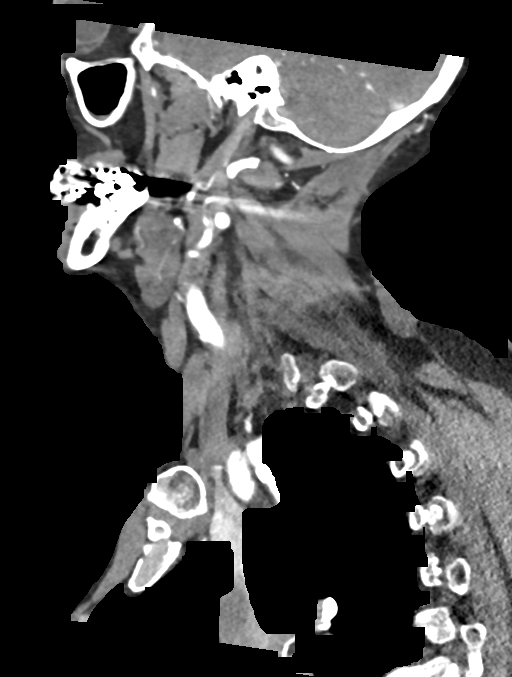
[im 165/201  soft-tissue]
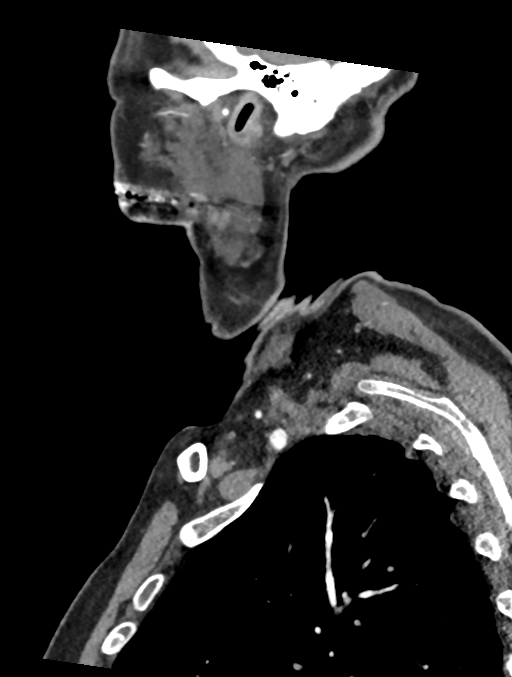

[10 of 36 positions shown; findings below may reference images not displayed]

FINDINGS: Aortic arch: Atherosclerotic calcification in the aortic arch
without aneurysm or dissection. Left carotid origin from the
innominate. Mild atherosclerotic disease in the proximal left common
carotid artery and in the right subclavian artery without
significant stenosis. Atherosclerotic calcification proximal left
subclavian artery with approximately 50% diameter stenosis.

Right carotid system: Scattered atherosclerotic calcification right
common carotid artery without stenosis. Atherosclerotic
calcification proximal right internal carotid artery with
approximately 25% diameter stenosis.

Left carotid system: Heavily calcified plaque and severe stenosis
involving the proximal left internal carotid artery. Due to
extensive calcification, the opacified lumen is very difficult to
accurately measure. This is a high-grade stenosis with small caliber
internal carotid artery above the stenosis.

Vertebral arteries: Left vertebral artery dominant and widely patent
to the basilar without stenosis.

Proximal right vertebral artery has faint opacification and occludes
at approximately C6. There is reconstitution at the C2 level with
faint opacification and patency to the basilar.

Skeleton: Cervical spondylosis.  No acute skeletal abnormality.

Other neck: 10 mm left thyroid nodule. No further imaging necessary
based on nodule size. No adenopathy in the neck.

Upper chest: Apical emphysema without acute abnormality.
IMPRESSION: 1. 25% diameter stenosis proximal right internal carotid artery due
to calcific plaque
2. Heavily calcified and extensive plaque in the proximal left
internal carotid artery with high-grade stenosis estimated 90%
diameter stenosis. Left internal carotid artery is patent with small
caliber and decreased flow in the cervical internal carotid artery
above the stenosis.
3. Left vertebral artery widely patent
4. Segmental occlusion mid right vertebral artery with faint
opacification distally.
5. Aortic Atherosclerosis (9CP7G-48E.E) and Emphysema (9CP7G-F4B.D).

## 2022-01-09 ENCOUNTER — Other Ambulatory Visit: Payer: Self-pay | Admitting: Internal Medicine

## 2022-01-09 DIAGNOSIS — I739 Peripheral vascular disease, unspecified: Secondary | ICD-10-CM

## 2022-04-12 ENCOUNTER — Ambulatory Visit
Admission: RE | Admit: 2022-04-12 | Discharge: 2022-04-12 | Disposition: A | Discharge status: Home / Self Care | Payer: Medicare Other | Source: Ambulatory Visit | Attending: Internal Medicine | Admitting: Internal Medicine

## 2022-04-12 DIAGNOSIS — I739 Peripheral vascular disease, unspecified: Secondary | ICD-10-CM

## 2022-05-08 ENCOUNTER — Other Ambulatory Visit: Payer: Self-pay | Admitting: *Deleted

## 2022-05-08 DIAGNOSIS — I6523 Occlusion and stenosis of bilateral carotid arteries: Secondary | ICD-10-CM

## 2022-05-22 NOTE — Progress Notes (Unsigned)
HISTORY AND PHYSICAL     CC:  follow up. Requesting Provider:  Sueanne Margarita, DO  HPI: This is a 89 y.o. male here for follow up for carotid artery stenosis.  Pt is s/p left CEA for asymptomatic carotid artery stenosis on 09/23/2019 by Dr. Donnetta Hutching.    Pt was last seen 10/12/2021 and at that time he had some dizziness and had a CTA that revealed right SCA stenosis and right CCA and ICA with less than 50% stenosis.  The left ICA did not have any recurrent stenosis.  His dizziness had improved with hydration and he was not having any other neurological sx.    Pt returns today for follow up.    Pt denies any amaurosis fugax, speech difficulties, weakness, numbness, paralysis or clumsiness or facial droop.    He states that he had covid twice in 2022, once early in the year and again in September.  He states since then, he has had weakness in both legs with walking.  He denies any claudication, rest pain or non healing wounds.   The pt is on a statin for cholesterol management.  The pt is on a daily aspirin.   Other AC:  none The pt is on BB for hypertension.   The pt does not have diabetes Tobacco hx:  former   Past Medical History:  Diagnosis Date   Carotid artery stenosis    Hypertension    Unsure is on Metoprolol   Skin cancer     Past Surgical History:  Procedure Laterality Date   ENDARTERECTOMY Left 09/23/2019   Procedure: ENDARTERECTOMY LEFT CAROTID with PATCH ANGIOPLASTY;  Surgeon: Rosetta Posner, MD;  Location: [facility] OR;  Service: Vascular;  Laterality: Left;   FRACTURE SURGERY Left    leg   SKIN CANCER EXCISION     TONSILLECTOMY     as a child    Allergies  Allergen Reactions   Sulfa Antibiotics     Other reaction(s): stomach upset   Tape     Needs paper tape    Current Outpatient Medications  Medication Sig Dispense Refill   aspirin EC 81 MG tablet Take 81 mg by mouth every other day. Swallow whole.     finasteride (PROSCAR) 5 MG tablet Take 5 mg by mouth daily.      metoprolol succinate (TOPROL-XL) 25 MG 24 hr tablet Take 12.5 mg by mouth daily.     Multiple Vitamins-Minerals (MULTIVITAMIN WITH MINERALS) tablet Take 1 tablet by mouth daily.     rosuvastatin (CRESTOR) 5 MG tablet Take 5 mg by mouth once a week.     No current facility-administered medications for this visit.    No family history on file.  Social History   Socioeconomic History   Marital status: Married    Spouse name: Not on file   Number of children: Not on file   Years of education: Not on file   Highest education level: Not on file  Occupational History   Not on file  Tobacco Use   Smoking status: Former    Packs/day: 3.00    Years: 30.00    Additional pack years: 0.00    Total pack years: 90.00    Types: Cigarettes    Start date: 34    Quit date: 02/27/1980    Years since quitting: 42.2   Smokeless tobacco: Never  Vaping Use   Vaping Use: Never used  Substance and Sexual Activity   Alcohol use: Yes  Alcohol/week: 7.0 standard drinks of alcohol    Types: 7 Shots of liquor per week    Comment: 1 drink daily    Drug use: Never   Sexual activity: Not on file  Other Topics Concern   Not on file  Social History Narrative   Not on file   Social Determinants of Health   Financial Resource Strain: Not on file  Food Insecurity: Not on file  Transportation Needs: Not on file  Physical Activity: Not on file  Stress: Not on file  Social Connections: Not on file  Intimate Partner Violence: Not on file     REVIEW OF SYSTEMS:   [X]  denotes positive finding, [ ]  denotes negative finding Cardiac  Comments:  Chest pain or chest pressure:    Shortness of breath upon exertion:    Short of breath when lying flat:    Irregular heart rhythm:        Vascular    Pain in calf, thigh, or hip brought on by ambulation:    Pain in feet at night that wakes you up from your sleep:     Blood clot in your veins:    Leg swelling:         Pulmonary    Oxygen at home:     Productive cough:     Wheezing:         Neurologic    Sudden weakness in arms or legs:     Sudden numbness in arms or legs:     Sudden onset of difficulty speaking or slurred speech:    Temporary loss of vision in one eye:     Problems with dizziness:         Gastrointestinal    Blood in stool:     Vomited blood:         Genitourinary    Burning when urinating:     Blood in urine:        Psychiatric    Major depression:         Hematologic    Bleeding problems:    Problems with blood clotting too easily:        Skin    Rashes or ulcers:        Constitutional    Fever or chills:      PHYSICAL EXAMINATION:  Today's Vitals   05/24/22 0932 05/24/22 0933  BP: 135/72 138/77  Pulse: 64   Temp: 97.8 F (36.6 C)   TempSrc: Temporal   SpO2: 90%   Weight: 185 lb (83.9 kg)    Body mass index is 24.41 kg/m.   General:  WDWN in NAD; vital signs documented above Gait: Not observed HENT: WNL, normocephalic Pulmonary: normal non-labored breathing Cardiac: regular HR, with carotid bruit on the left Abdomen: soft, NT; aortic pulse is not palpable Skin: without rashes Vascular Exam/Pulses: Palpable bilateral radial and AT pulses Extremities: without open wounds Musculoskeletal: no muscle wasting or atrophy  Neurologic: A&O X 3; moving all extremities equally; speech is fluent/normal Psychiatric:  The pt has Normal affect.   Non-Invasive Vascular Imaging:   Carotid Duplex on 05/24/2022 Right:  1-39% ICA stenosis Left:  1-39% ICA stenosis      ASSESSMENT/PLAN:: 89 y.o. male here for follow up carotid artery stenosis and hx of left CEA for asymptomatic carotid artery stenosis on 09/23/2019 by Dr. Donnetta Hutching.    -duplex today reveals 1-39% bilateral ICA stenosis and pt remains asymptomatic.   -discussed s/s of stroke with pt  and he understands should he develop any of these sx, he will go to the nearest ER or call 911. -pt will f/u in one year with carotid duplex -pt  will call sooner should he have any issues. -continue asa.  He has stopped the statin given the weakness he has been having in his legs. He take the asa every other day.    Leontine Locket, [facility] [phone]  Clinic MD:  Scot Dock

## 2022-05-24 ENCOUNTER — Ambulatory Visit: Payer: Medicare Other | Admitting: Physician Assistant

## 2022-05-24 ENCOUNTER — Ambulatory Visit (HOSPITAL_COMMUNITY)
Admission: RE | Admit: 2022-05-24 | Discharge: 2022-05-24 | Disposition: A | Discharge status: Home / Self Care | Payer: Medicare Other | Source: Ambulatory Visit | Attending: Vascular Surgery | Admitting: Vascular Surgery

## 2022-05-24 VITALS — BP 138/77 | HR 64 | Temp 97.8°F | Wt 185.0 lb

## 2022-05-24 DIAGNOSIS — I6523 Occlusion and stenosis of bilateral carotid arteries: Secondary | ICD-10-CM

## 2022-09-19 ENCOUNTER — Other Ambulatory Visit (HOSPITAL_COMMUNITY): Payer: Self-pay | Admitting: Internal Medicine

## 2022-09-19 DIAGNOSIS — I440 Atrioventricular block, first degree: Secondary | ICD-10-CM

## 2022-09-26 ENCOUNTER — Encounter: Payer: Self-pay | Admitting: Internal Medicine

## 2022-09-26 ENCOUNTER — Ambulatory Visit (INDEPENDENT_AMBULATORY_CARE_PROVIDER_SITE_OTHER): Payer: Medicare Other

## 2022-09-26 ENCOUNTER — Ambulatory Visit: Payer: Medicare Other | Attending: Internal Medicine | Admitting: Internal Medicine

## 2022-09-26 VITALS — BP 126/74 | HR 64 | Ht 73.0 in | Wt 185.2 lb

## 2022-09-26 DIAGNOSIS — Z9889 Other specified postprocedural states: Secondary | ICD-10-CM | POA: Diagnosis present

## 2022-09-26 DIAGNOSIS — I10 Essential (primary) hypertension: Secondary | ICD-10-CM

## 2022-09-26 DIAGNOSIS — R531 Weakness: Secondary | ICD-10-CM | POA: Insufficient documentation

## 2022-09-26 DIAGNOSIS — R002 Palpitations: Secondary | ICD-10-CM | POA: Diagnosis present

## 2022-09-26 DIAGNOSIS — I700 Atherosclerosis of aorta: Secondary | ICD-10-CM

## 2022-09-26 DIAGNOSIS — I453 Trifascicular block: Secondary | ICD-10-CM | POA: Insufficient documentation

## 2022-09-26 DIAGNOSIS — E785 Hyperlipidemia, unspecified: Secondary | ICD-10-CM | POA: Insufficient documentation

## 2022-09-26 DIAGNOSIS — I2510 Atherosclerotic heart disease of native coronary artery without angina pectoris: Secondary | ICD-10-CM | POA: Diagnosis present

## 2022-09-26 NOTE — Progress Notes (Unsigned)
ZIO XT serial # [id number] from office inventory applied to patient.

## 2022-09-26 NOTE — Patient Instructions (Signed)
Medication Instructions:  No changes *If you need a refill on your cardiac medications before your next appointment, please call your pharmacy*   Lab Work: Today: cbc, cmet, reflext tsh If you have labs (blood work) drawn today and your tests are completely normal, you will receive your results only by: MyChart Message (if you have MyChart) OR A paper copy in the mail If you have any lab test that is abnormal or we need to change your treatment, we will call you to review the results.   Testing/Procedures: Your physician has requested that you have an echocardiogram. Echocardiography is a painless test that uses sound waves to create images of your heart. It provides your doctor with information about the size and shape of your heart and how well your heart's chambers and valves are working. This procedure takes approximately one hour. There are no restrictions for this procedure. Please do NOT wear cologne, perfume, aftershave, or lotions (deodorant is allowed). Please arrive 15 minutes prior to your appointment time.  Zio Heart Monitor - to be applied today   Follow-Up: At [facility], you and your health needs are our priority.  As part of our continuing mission to provide you with exceptional heart care, we have created designated Provider Care Teams.  These Care Teams include your primary Cardiologist (physician) and Advanced Practice Providers (APPs -  Physician Assistants and Nurse Practitioners) who all work together to provide you with the care you need, when you need it.   Your next appointment:   3 month(s)  Provider:   Jari Favre, PA-C, Ronie Spies, PA-C, Robin Searing, NP, Jacolyn Reedy, PA-C, Eligha Bridegroom, NP, Tereso Newcomer, PA-C, or Perlie Gold, PA-C     You have been referred to Cardiac Electrophysiology

## 2022-09-26 NOTE — Progress Notes (Signed)
Cardiology Office Note:   Date:  09/26/2022  ID:  Roger Benjamin, DOB April 16, 1931, MRN [id number] PCP:  Charlane Ferretti, DO  [facility] Providers Cardiologist:  Alverda Skeans, MD Referring MD: Charlane Ferretti, DO   Chief Complaint/Reason for Referral: Weakness ASSESSMENT:    1. Weakness   2. Aortic atherosclerosis (HCC)   3. Coronary artery calcification seen on CAT scan   4. History of left-sided carotid endarterectomy   5. Hyperlipidemia LDL goal <70   6. Primary hypertension   7. Trifascicular block   8. Palpitations     PLAN:   In order of problems listed above: 1.  Weakness: Will check CBC, CMP, reflex TSH, echocardiogram, and monitor. 2.  Aortic atherosclerosis: Continue aspirin; patient could not tolerate statin.  Given advanced age I do not think this is the sole required. 3.  Coronary artery calcification: Continue aspirin for now. 4.  History of left-sided carotid endarterectomy: Continue aspirin blood pressure control. 5.  Hyperlipidemia: Given advanced age and do not think strict lipid lowering is required. 6.  Hypertension: Blood pressure is well-controlled today off all antihypertensives. 7.  Trifascicular block: Will obtain monitor and refer to EP given symptoms.             Dispo:  Return in about 3 months (around 12/27/2022).      Medication Adjustments/Labs and Tests Ordered: Current medicines are reviewed at length with the patient today.  Concerns regarding medicines are outlined above.  The following changes have been made:    Labs/tests ordered: Orders Placed This Encounter  Procedures   Comprehensive metabolic panel   CBC   TSH Rfx on Abnormal to Free T4   Ambulatory referral to Cardiac Electrophysiology   LONG TERM MONITOR (3-14 DAYS)   EKG 12-Lead   ECHOCARDIOGRAM COMPLETE    Medication Changes: No orders of the defined types were placed in this encounter.   Current medicines are reviewed at length with the patient today.  The  patient does not have concerns regarding medicines.  History of Present Illness:   FOCUSED PROBLEM LIST:   1.  Aortic atherosclerosis and coronary artery calcification on chest CT 2022 2.  Carotid disease status post left carotid endarterectomy 2021 3.  Hyperlipidemia 4.  Hypertension 5.  Trifascicular block with first-degree AV block, right bundle branch block, and left anterior fascicular block  The patient is a 89 y.o. male with the indicated medical history here for recommendations regarding weakness.  The patient was seen by the primary care provider recently.  He had episodes of weakness with occasional lightheadedness.  His metoprolol was decreased and eventually stopped..  There was concern about chronotropic incompetence.  This is why is here today.  His EKG today demonstrates trifascicular block.  He tells me that over the last several weeks he has had increasing fatigue and lightheadedness.  He normally walks at the gym.  He has noticed that when he tries to do this he cannot do as much as he used to be able to do.  He also has noticed that when he goes from sitting to standing he will feel lightheaded.  This occasionally happens when he is walking as well.  He fortunately does not develop any shortness of breath or exertional angina.  He has not developed frank syncope.  He denies any peripheral edema or paroxysmal nocturnal dyspnea.         Previous Medical History: Past Medical History:  Diagnosis Date   Carotid artery stenosis  Hypertension    Unsure is on Metoprolol   Skin cancer      Current Medications: Current Meds  Medication Sig   aspirin EC 81 MG tablet Take 81 mg by mouth every other day. Swallow whole.   Cyanocobalamin (VITAMIN B 12 PO) Take 1 tablet by mouth daily.   finasteride (PROSCAR) 5 MG tablet Take 5 mg by mouth daily.   metoprolol succinate (TOPROL-XL) 25 MG 24 hr tablet Take 12.5 mg by mouth daily.   Multiple Vitamins-Minerals (MULTIVITAMIN WITH  MINERALS) tablet Take 1 tablet by mouth daily.   rosuvastatin (CRESTOR) 5 MG tablet Take 5 mg by mouth once a week.     Allergies:    Sulfa antibiotics and Tape   Social History:   Social History   Tobacco Use   Smoking status: Former    Current packs/day: 0.00    Average packs/day: 3.0 packs/day for 30.0 years (90.0 ttl pk-yrs)    Types: Cigarettes    Start date: 74    Quit date: 02/27/1980    Years since quitting: 42.6   Smokeless tobacco: Never  Vaping Use   Vaping status: Never Used  Substance Use Topics   Alcohol use: Yes    Alcohol/week: 7.0 standard drinks of alcohol    Types: 7 Shots of liquor per week    Comment: 1 drink daily    Drug use: Never     Family Hx: History reviewed. No pertinent family history.   Review of Systems:   Please see the history of present illness.    All other systems reviewed and are negative.     EKGs/Labs/Other Test Reviewed:   EKG:    EKG Interpretation Date/Time:    Ventricular Rate:    PR Interval:    QRS Duration:    QT Interval:    QTC Calculation:   R Axis:      Text Interpretation:          Prior CV studies reviewed:     Other studies Reviewed: CT scan of chest demonstrating aortic atherosclerosis and coronary artery calcification  Recent Labs: No results found for requested labs within last 365 days.   Lipid Panel    Component Value Date/Time   CHOL 158 09/24/2019 0500   TRIG 44 09/24/2019 0500   HDL 50 09/24/2019 0500   CHOLHDL 3.2 09/24/2019 0500   VLDL 9 09/24/2019 0500   LDLCALC 99 09/24/2019 0500    Risk Assessment/Calculations:          Physical Exam:   VS:  BP 126/74   Pulse 64   Ht 6\' 1"  (1.854 m)   Wt 185 lb 3.2 oz (84 kg)   SpO2 (!) 4%   BMI 24.43 kg/m        Wt Readings from Last 3 Encounters:  09/26/22 185 lb 3.2 oz (84 kg)  05/24/22 185 lb (83.9 kg)  10/12/21 184 lb (83.5 kg)      GENERAL:  No apparent distress, AOx3 HEENT:  No carotid bruits, +2 carotid impulses, no  scleral icterus CAR: RRR no murmurs, gallops, rubs, or thrills RES:  Clear to auscultation bilaterally ABD:  Soft, nontender, nondistended, positive bowel sounds x 4 VASC:  +2 radial pulses, +2 carotid pulses, palpable pedal pulses NEURO:  CN 2-12 grossly intact; motor and sensory grossly intact PSYCH:  No active depression or anxiety EXT:  No edema, ecchymosis, or cyanosis  Signed, Orbie Pyo, MD  09/26/2022 5:28 PM    [facility] [address] [place], [place], [place]  [address] Phone: [phone]; Fax: [phone]   Note:  This document was prepared using Dragon voice recognition software and may include unintentional dictation errors.

## 2022-10-16 ENCOUNTER — Ambulatory Visit (HOSPITAL_COMMUNITY): Payer: Medicare Other | Attending: Internal Medicine

## 2022-10-16 DIAGNOSIS — R531 Weakness: Secondary | ICD-10-CM | POA: Insufficient documentation

## 2022-10-16 DIAGNOSIS — I2510 Atherosclerotic heart disease of native coronary artery without angina pectoris: Secondary | ICD-10-CM | POA: Diagnosis not present

## 2022-10-16 DIAGNOSIS — E785 Hyperlipidemia, unspecified: Secondary | ICD-10-CM | POA: Insufficient documentation

## 2022-10-16 DIAGNOSIS — I700 Atherosclerosis of aorta: Secondary | ICD-10-CM | POA: Insufficient documentation

## 2022-10-16 DIAGNOSIS — Z9889 Other specified postprocedural states: Secondary | ICD-10-CM | POA: Insufficient documentation

## 2022-10-16 DIAGNOSIS — I10 Essential (primary) hypertension: Secondary | ICD-10-CM | POA: Insufficient documentation

## 2022-10-16 DIAGNOSIS — I453 Trifascicular block: Secondary | ICD-10-CM | POA: Diagnosis present

## 2022-10-16 LAB — ECHOCARDIOGRAM COMPLETE
Area-P 1/2: 2.63 cm2
S' Lateral: 2.7 cm

## 2022-10-17 ENCOUNTER — Telehealth: Payer: Self-pay | Admitting: *Deleted

## 2022-10-17 MED ORDER — LOSARTAN POTASSIUM 25 MG PO TABS: 12.5000 mg | ORAL_TABLET | Freq: Every day | ORAL | 3 refills | Status: DC

## 2022-10-17 NOTE — Telephone Encounter (Signed)
Spoke w patient. Reviewed echo results and recommendation. Pt will start losartan 12.5 mg daily at bedtime. He will pick up tomorrow.  Reviewed his follow up appointments.

## 2022-10-17 NOTE — Telephone Encounter (Signed)
-----   Message from Orbie Pyo sent at 10/16/2022  1:43 PM EDT ----- Start losartan 12.5mg  qpm.

## 2022-11-13 ENCOUNTER — Ambulatory Visit: Payer: Medicare Other | Attending: Internal Medicine | Admitting: Internal Medicine

## 2022-11-13 ENCOUNTER — Encounter: Payer: Self-pay | Admitting: Internal Medicine

## 2022-11-13 VITALS — BP 142/62 | HR 56 | Ht 73.0 in | Wt 189.2 lb

## 2022-11-13 DIAGNOSIS — I441 Atrioventricular block, second degree: Secondary | ICD-10-CM | POA: Diagnosis present

## 2022-11-13 NOTE — Patient Instructions (Addendum)
Medication Instructions:  Your physician recommends that you continue on your current medications as directed. Please refer to the Current Medication list given to you today.  *If you need a refill on your cardiac medications before your next appointment, please call your pharmacy*  Lab Work: None ordered.  If you have labs (blood work) drawn today and your tests are completely normal, you will receive your results only by: MyChart Message (if you have MyChart) OR A paper copy in the mail If you have any lab test that is abnormal or we need to change your treatment, we will call you to review the results.  Testing/Procedures: None ordered.  Follow-Up: At [facility], you and your health needs are our priority.  As part of our continuing mission to provide you with exceptional heart care, we have created designated Provider Care Teams.  These Care Teams include your primary Cardiologist (physician) and Advanced Practice Providers (APPs -  Physician Assistants and Nurse Practitioners) who all work together to provide you with the care you need, when you need it.  Your next appointment:   As needed.  The format for your next appointment:   In Person  Provider:   Lewayne Bunting, MD{or one of the following Advanced Practice Providers on your designated Care Team:   Francis Dowse, [place] Casimiro Needle "Mardelle Matte" [place], [place] Earnest Rosier, NP   Important Information About Sugar

## 2022-11-13 NOTE — Progress Notes (Signed)
HPI Mr. Roger Benjamin is referred by Dr. Geralynn Benjamin for evaluation of heart block. He is a pleasant 89 yo man with a h/o remote heavy tobacco abuse, HTN and dyslipidemia. He has had spells where he feels poorly and a cardiac monitor demonstrated rare episodes of daytime heart block, 2:1 and AVWB. The patient has not had syncope. He denies visual changes. He has not had any long pauses.  Allergies  Allergen Reactions   Sulfa Antibiotics     Other reaction(s): stomach upset   Tape     Needs paper tape     Current Outpatient Medications  Medication Sig Dispense Refill   aspirin EC 81 MG tablet Take 81 mg by mouth every other day. Swallow whole.     Cyanocobalamin (VITAMIN B 12 PO) Take 1 tablet by mouth daily.     finasteride (PROSCAR) 5 MG tablet Take 5 mg by mouth daily.     losartan (COZAAR) 25 MG tablet Take 0.5 tablets (12.5 mg total) by mouth at bedtime. 45 tablet 3   metoprolol succinate (TOPROL-XL) 25 MG 24 hr tablet Take 12.5 mg by mouth daily.     Multiple Vitamins-Minerals (MULTIVITAMIN WITH MINERALS) tablet Take 1 tablet by mouth daily.     rosuvastatin (CRESTOR) 5 MG tablet Take 5 mg by mouth once a week.     No current facility-administered medications for this visit.     Past Medical History:  Diagnosis Date   Carotid artery stenosis    Hypertension    Unsure is on Metoprolol   Skin cancer     ROS:   All systems reviewed and negative except as noted in the HPI.   Past Surgical History:  Procedure Laterality Date   ENDARTERECTOMY Left 09/23/2019   Procedure: ENDARTERECTOMY LEFT CAROTID with PATCH ANGIOPLASTY;  Surgeon: Roger Earthly, MD;  Location: [facility] OR;  Service: Vascular;  Laterality: Left;   FRACTURE SURGERY Left    leg   SKIN CANCER EXCISION     TONSILLECTOMY     as a child     History reviewed. No pertinent family history.   Social History   Socioeconomic History   Marital status: Married    Spouse name: Not on file   Number of children: Not  on file   Years of education: Not on file   Highest education level: Not on file  Occupational History   Not on file  Tobacco Use   Smoking status: Former    Current packs/day: 0.00    Average packs/day: 3.0 packs/day for 30.0 years (90.0 ttl pk-yrs)    Types: Cigarettes    Start date: 67    Quit date: 02/27/1980    Years since quitting: 42.7   Smokeless tobacco: Never  Vaping Use   Vaping status: Never Used  Substance and Sexual Activity   Alcohol use: Yes    Alcohol/week: 7.0 standard drinks of alcohol    Types: 7 Shots of liquor per week    Comment: 1 drink daily    Drug use: Never   Sexual activity: Not on file  Other Topics Concern   Not on file  Social History Narrative   Not on file   Social Determinants of Health   Financial Resource Strain: Not on file  Food Insecurity: Not on file  Transportation Needs: Not on file  Physical Activity: Not on file  Stress: Not on file  Social Connections: Not on file  Intimate Partner Violence: Not on file  BP (!) 142/62   Pulse (!) 56   Ht 6\' 1"  (1.854 m)   Wt 189 lb 3.2 oz (85.8 kg)   SpO2 96%   BMI 24.96 kg/m   Physical Exam:  Well appearing NAD HEENT: Unremarkable Neck:  No JVD, no thyromegally Lymphatics:  No adenopathy Back:  No CVA tenderness Lungs:  Clear with no wheezes HEART:  Regular rate rhythm, no murmurs, no rubs, no clicks Abd:  soft, positive bowel sounds, no organomegally, no rebound, no guarding Ext:  2 plus pulses, no edema, no cyanosis, no clubbing Skin:  No rashes no nodules Neuro:  CN II through XII intact, motor grossly intact  EKG - nsr with first degree AVB, RBBB, and LAFB.   Assess/Plan: Trifascicular block - He walked in the halls toay and his HR got up to 85, and oxygen sat down to 86%. He moved slowly but did not brady down. I reviewed his monitor. He does not have a clear indication for PPM at this time though the may eventually need one. I discussed the warning signs that his  conduction was worsening including syncope, unexpected falls and vision changes. He will call us if he experiences any of the above symptoms. I do not think a PPM at this time will make him feel any better.   Roger Benjamin Roger Golberg,MD

## 2022-11-22 ENCOUNTER — Ambulatory Visit (HOSPITAL_BASED_OUTPATIENT_CLINIC_OR_DEPARTMENT_OTHER): Payer: Medicare Other | Admitting: Pulmonary Disease

## 2022-11-22 ENCOUNTER — Telehealth (HOSPITAL_COMMUNITY): Payer: Self-pay

## 2022-11-22 ENCOUNTER — Encounter (HOSPITAL_BASED_OUTPATIENT_CLINIC_OR_DEPARTMENT_OTHER): Payer: Self-pay | Admitting: Pulmonary Disease

## 2022-11-22 VITALS — BP 132/78 | HR 83 | Ht 73.0 in | Wt 188.0 lb

## 2022-11-22 DIAGNOSIS — J449 Chronic obstructive pulmonary disease, unspecified: Secondary | ICD-10-CM

## 2022-11-22 NOTE — Telephone Encounter (Signed)
Called patient to see if he is interested in the Pulmonary Rehab Program. Patient expressed interest but Is not sure he wants to attend. Explained scheduling process and went over insurance, patient verbalized understanding. Someone from our pulmonary rehab staff will contact pt at a later time.

## 2022-11-22 NOTE — Patient Instructions (Signed)
Mild COPD ILD, may be post-inflammatory Deconditioning --No desaturations on ambulatory O2 --Refer to Pulmonary Rehab

## 2022-11-22 NOTE — Telephone Encounter (Signed)
Office referral recv'ed, printed and given to RN for review.

## 2022-11-26 ENCOUNTER — Encounter (HOSPITAL_BASED_OUTPATIENT_CLINIC_OR_DEPARTMENT_OTHER): Payer: Self-pay | Admitting: Pulmonary Disease

## 2022-11-27 ENCOUNTER — Telehealth (HOSPITAL_COMMUNITY): Payer: Self-pay

## 2022-11-27 ENCOUNTER — Encounter (HOSPITAL_COMMUNITY): Payer: Self-pay

## 2022-11-27 NOTE — Telephone Encounter (Signed)
Pt returned PR phone call and stated he is not interested at this time, he is walking at the [facility].   Closed referral

## 2022-11-27 NOTE — Telephone Encounter (Signed)
Attempted to call patient in regards to Pulmonary Rehab - LM on VM Mailed letter 

## 2022-12-06 ENCOUNTER — Encounter (HOSPITAL_BASED_OUTPATIENT_CLINIC_OR_DEPARTMENT_OTHER): Payer: Self-pay | Admitting: Emergency Medicine

## 2022-12-06 ENCOUNTER — Emergency Department (HOSPITAL_BASED_OUTPATIENT_CLINIC_OR_DEPARTMENT_OTHER): Payer: Medicare Other

## 2022-12-06 ENCOUNTER — Inpatient Hospital Stay (HOSPITAL_BASED_OUTPATIENT_CLINIC_OR_DEPARTMENT_OTHER)
Admission: EM | Admit: 2022-12-06 | Discharge: 2022-12-07 | DRG: 066 | Discharge status: Against Medical Advice | Payer: Medicare Other | Attending: Family Medicine | Admitting: Family Medicine

## 2022-12-06 DIAGNOSIS — R42 Dizziness and giddiness: Secondary | ICD-10-CM | POA: Diagnosis present

## 2022-12-06 DIAGNOSIS — E785 Hyperlipidemia, unspecified: Secondary | ICD-10-CM | POA: Diagnosis present

## 2022-12-06 DIAGNOSIS — I2510 Atherosclerotic heart disease of native coronary artery without angina pectoris: Secondary | ICD-10-CM | POA: Diagnosis present

## 2022-12-06 DIAGNOSIS — I639 Cerebral infarction, unspecified: Secondary | ICD-10-CM

## 2022-12-06 DIAGNOSIS — H9192 Unspecified hearing loss, left ear: Secondary | ICD-10-CM | POA: Diagnosis not present

## 2022-12-06 DIAGNOSIS — I441 Atrioventricular block, second degree: Secondary | ICD-10-CM | POA: Diagnosis present

## 2022-12-06 DIAGNOSIS — Z888 Allergy status to other drugs, medicaments and biological substances status: Secondary | ICD-10-CM

## 2022-12-06 DIAGNOSIS — R4181 Age-related cognitive decline: Secondary | ICD-10-CM | POA: Diagnosis present

## 2022-12-06 DIAGNOSIS — Z882 Allergy status to sulfonamides status: Secondary | ICD-10-CM | POA: Diagnosis not present

## 2022-12-06 DIAGNOSIS — I10 Essential (primary) hypertension: Secondary | ICD-10-CM | POA: Diagnosis not present

## 2022-12-06 DIAGNOSIS — R29701 NIHSS score 1: Secondary | ICD-10-CM | POA: Diagnosis present

## 2022-12-06 DIAGNOSIS — I63443 Cerebral infarction due to embolism of bilateral cerebellar arteries: Principal | ICD-10-CM | POA: Diagnosis present

## 2022-12-06 DIAGNOSIS — J439 Emphysema, unspecified: Secondary | ICD-10-CM | POA: Diagnosis not present

## 2022-12-06 DIAGNOSIS — R270 Ataxia, unspecified: Secondary | ICD-10-CM

## 2022-12-06 DIAGNOSIS — Z79899 Other long term (current) drug therapy: Secondary | ICD-10-CM | POA: Diagnosis not present

## 2022-12-06 DIAGNOSIS — Z7982 Long term (current) use of aspirin: Secondary | ICD-10-CM | POA: Diagnosis not present

## 2022-12-06 DIAGNOSIS — N400 Enlarged prostate without lower urinary tract symptoms: Secondary | ICD-10-CM | POA: Diagnosis present

## 2022-12-06 DIAGNOSIS — Z85828 Personal history of other malignant neoplasm of skin: Secondary | ICD-10-CM | POA: Diagnosis not present

## 2022-12-06 DIAGNOSIS — Z5329 Procedure and treatment not carried out because of patient's decision for other reasons: Secondary | ICD-10-CM | POA: Diagnosis present

## 2022-12-06 DIAGNOSIS — Z95828 Presence of other vascular implants and grafts: Secondary | ICD-10-CM

## 2022-12-06 DIAGNOSIS — I6389 Other cerebral infarction: Secondary | ICD-10-CM | POA: Diagnosis present

## 2022-12-06 DIAGNOSIS — Z7902 Long term (current) use of antithrombotics/antiplatelets: Secondary | ICD-10-CM | POA: Diagnosis not present

## 2022-12-06 DIAGNOSIS — Z87891 Personal history of nicotine dependence: Secondary | ICD-10-CM

## 2022-12-06 LAB — CBC
HCT: 44.6 % (ref 39.0–52.0)
Hemoglobin: 15.4 g/dL (ref 13.0–17.0)
MCH: 31.8 pg (ref 26.0–34.0)
MCHC: 34.5 g/dL (ref 30.0–36.0)
MCV: 92 fL (ref 80.0–100.0)
Platelets: 255 10*3/uL (ref 150–400)
RBC: 4.85 MIL/uL (ref 4.22–5.81)
RDW: 13.5 % (ref 11.5–15.5)
WBC: 13.9 10*3/uL — ABNORMAL HIGH (ref 4.0–10.5)
nRBC: 0 % (ref 0.0–0.2)

## 2022-12-06 LAB — BASIC METABOLIC PANEL
Anion gap: 7 (ref 5–15)
BUN: 23 mg/dL (ref 8–23)
CO2: 26 mmol/L (ref 22–32)
Calcium: 9.4 mg/dL (ref 8.9–10.3)
Chloride: 99 mmol/L (ref 98–111)
Creatinine, Ser: 0.9 mg/dL (ref 0.61–1.24)
GFR, Estimated: >60 mL/min (ref >60)
Glucose, Bld: 120 mg/dL — ABNORMAL HIGH (ref 70–99)
Potassium: 4.2 mmol/L (ref 3.5–5.1)
Sodium: 132 mmol/L — ABNORMAL LOW (ref 135–145)

## 2022-12-06 LAB — TROPONIN I (HIGH SENSITIVITY): Troponin I (High Sensitivity): 13 ng/L (ref <18)

## 2022-12-06 MED ORDER — MECLIZINE HCL 25 MG PO TABS
25.0000 mg | ORAL_TABLET | Freq: Once | ORAL | Status: AC
  Administered 2022-12-06: 25 mg via ORAL
  Filled 2022-12-06: qty 1

## 2022-12-06 NOTE — ED Notes (Signed)
PT had no complaints during Ortho VS. States he is feeling better than when he arrived.

## 2022-12-06 NOTE — ED Provider Notes (Signed)
EMERGENCY DEPARTMENT AT [facility] Provider Note   CSN: [id number] Arrival date & time: 12/06/22  2015     History  Chief Complaint  Patient presents with   Dizziness    Roger Benjamin is a 89 y.o. male.  89 year old male with history of prior tobacco use, hypertension, hyperlipidemia, and second-degree type I heart block who presents to the emergency department with dizziness.  Patient reports that since lunch at noon has had some difficulty walking and dizziness.  Says that it is not a room spinning and or lightheaded sensation but just a "strange feeling in his head".  Not exacerbated by turning his head or standing.  Has had episodes that last for short period of time but none that have been prolonged like this 1.  Has been eating and drinking normally.  No vision changes, slurred speech, weakness or numbness of his arms or legs.  No tenderness.  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       Home Medications Prior to Admission medications   Medication Sig Start Date End Date Taking? Authorizing Provider  aspirin EC 81 MG tablet Take 81 mg by mouth every other day. Swallow whole.    [provider]  Cyanocobalamin (VITAMIN B 12 PO) Take 1 tablet by mouth daily.    [provider]  finasteride (PROSCAR) 5 MG tablet Take 5 mg by mouth daily. 09/21/21   [provider]  losartan (COZAAR) 25 MG tablet Take 0.5 tablets (12.5 mg total) by mouth at bedtime. 10/17/22   Orbie Pyo, MD  metoprolol succinate (TOPROL-XL) 25 MG 24 hr tablet Take 12.5 mg by mouth daily.    [provider]  Multiple Vitamins-Minerals (MULTIVITAMIN WITH MINERALS) tablet Take 1 tablet by mouth daily.    [provider]  rosuvastatin (CRESTOR) 5 MG tablet Take 5 mg by mouth once a week. 09/21/21   [provider]  tamsulosin (FLOMAX) 0.4 MG CAPS capsule Take 0.4 mg by mouth daily. 10/09/22   [provider]      Allergies    Sulfa antibiotics and Tape    Review of Systems   Review of Systems  Physical Exam Updated Vital Signs BP 131/79   Pulse 74   Temp 98.1 F (36.7 C) (Oral)   Resp 16   SpO2 96%  Physical Exam Vitals and nursing note reviewed.  Constitutional:      General: He is not in acute distress.    Appearance: He is well-developed.  HENT:     Head: Normocephalic and atraumatic.     Right Ear: External ear normal.     Left Ear: External ear normal.     Nose: Nose normal.  Eyes:     Extraocular Movements: Extraocular movements intact.     Conjunctiva/sclera: Conjunctivae normal.     Pupils: Pupils are equal, round, and reactive to light.  Pulmonary:     Effort: Pulmonary effort is normal. No respiratory distress.  Musculoskeletal:     Cervical back: Normal range of motion and neck supple.     Right lower leg: Edema present.     Left lower leg: Edema present.  Skin:    General: Skin is warm and dry.  Neurological:     Mental Status: He is alert. Mental status is at baseline.     Comments: MENTAL STATUS: AAOx3 No aphasia CRANIAL NERVES: II: Pupils equal and reactive 5 mm BL,  no RAPD, no VF deficits III, IV, VI: EOM intact, no gaze preference or deviation, no nystagmus. V: normal sensation to light touch in V1, V2, and V3 segments bilaterally VII: no facial weakness or asymmetry, no nasolabial fold flattening VIII: normal hearing to speech and finger friction IX, X: normal palatal elevation, no uvular deviation XI: 5/5 head turn and 5/5 shoulder shrug bilaterally XII: midline tongue protrusion MOTOR: 5/5 strength in R shoulder flexion, elbow flexion and extension, and grip strength. 5/5 strength in L shoulder flexion, elbow flexion and extension, and grip strength.  5/5 strength in R hip and knee flexion, knee extension, ankle plantar and dorsiflexion. 5/5 strength in L hip and knee flexion, knee extension, ankle plantar and  dorsiflexion. SENSORY: Normal sensation to light touch in all extremities COORD: Normal finger to nose and heel to shin, no tremor, no dysmetria STATION: Truncal ataxia GAIT: Ataxic gait   Psychiatric:        Mood and Affect: Mood normal.        Behavior: Behavior normal.     ED Results / Procedures / Treatments   Labs (all labs ordered are listed, but only abnormal results are displayed) Labs Reviewed  BASIC METABOLIC PANEL - Abnormal; Notable for the following components:      Result Value   Sodium 132 (*)    Glucose, Bld 120 (*)    All other components within normal limits  CBC - Abnormal; Notable for the following components:   WBC 13.9 (*)    All other components within normal limits  TROPONIN I (HIGH SENSITIVITY)  TROPONIN I (HIGH SENSITIVITY)    EKG EKG Interpretation Date/Time:  Thursday December 06 2022 20:37:58 EDT Ventricular Rate:  84 PR Interval:  376 QRS Duration:  120 QT Interval:  398 QTC Calculation: 470 R Axis:   -80  Text Interpretation: Sinus rhythm with marked sinus arrhythmia with 1st degree A-V block Left axis deviation Right bundle branch block Inferior infarct , age undetermined Abnormal ECG When compared with ECG of 26-Sep-2022 16:00, Left anterior fascicular block is no longer Present T wave inversion no longer evident in Inferior leads Confirmed by Vonita Moss [phone]) on 12/06/2022 8:42:04 PM  Radiology CT Head Wo Contrast  Result Date: 12/06/2022 CLINICAL DATA:  Weakness and dizziness. EXAM: CT HEAD WITHOUT CONTRAST TECHNIQUE: Contiguous axial images were obtained from the base of the skull through the vertex without intravenous contrast. RADIATION DOSE REDUCTION: This exam was performed according to the departmental dose-optimization program which includes automated exposure control, adjustment of the mA and/or kV according to patient size and/or use of iterative reconstruction technique. COMPARISON:  Head CT 08/14/2021 FINDINGS: Brain:  Stable degree of atrophy and chronic small vessel ischemia. No acute hemorrhage, ischemia, hydrocephalus, extra-axial collection or mass lesion/mass effect. Vascular: Atherosclerosis of skullbase vasculature without hyperdense vessel or abnormal calcification. Skull: No fracture or focal lesion. Sinuses/Orbits: Mild chronic mucosal thickening of the ethmoid air cells. Occasional opacification of lower right mastoid air cells, chronic. No acute findings. Other: None. IMPRESSION: 1. No acute intracranial abnormality. 2. Stable atrophy and chronic small vessel ischemia. Electronically Signed   By: Narda Rutherford M.D.   On: 12/06/2022 22:34   DG Chest Portable 1 View  Result Date: 12/06/2022 CLINICAL DATA:  Dyspnea EXAM: PORTABLE CHEST 1 VIEW COMPARISON:  08/02/2019 FINDINGS: Coarsening of the pulmonary interstitium is chronic in leads to underlying severe emphysema better seen on CT examination of 02/23/2021. No superimposed confluent pulmonary infiltrate. No pneumothorax or pleural effusion.  Cardiac size within normal limits. No acute bone abnormality. IMPRESSION: 1. Severe emphysema. No superimposed acute cardiopulmonary process. Electronically Signed   By: Helyn Numbers M.D.   On: 12/06/2022 21:53    Procedures Procedures    Medications Ordered in ED Medications  meclizine (ANTIVERT) tablet 25 mg (25 mg Oral Given 12/06/22 2121)    ED Course/ Medical Decision Making/ A&P Clinical Course as of 12/06/22 2309  Thu Dec 06, 2022  2202 Dr Particia Nearing accepts to [place] for MRI [RP]    Clinical Course User Index [RP] Rondel Baton, MD                                 Medical Decision Making Amount and/or Complexity of Data Reviewed Labs: ordered. Radiology: ordered.   Roger Benjamin is a 89 y.o. male with comorbidities that complicate the patient evaluation including tobacco use, hypertension, hyperlipidemia, and second-degree type I heart block who presents to the emergency  department with dizziness.   Initial Ddx:  Stroke, peripheral vertigo, arrhythmia, orthostasis  MDM/Course:  Patient resents emergency department with dizziness that started at noon.  No apparent triggers.  Not fully consistent with vertigo or presyncope.  On exam is hemodynamically stable and euvolemic.  Does have significant difficulty with walking and appears to be ataxic.  Was given meclizine and CT of the head was obtained due to concerns for possible central cause of vertigo specially with his age and risk factors.  Unfortunately given his timeframe is outside of the window for any acute interventions.  No signs of LVO on his exam.  CT did not reveal any acute findings.  Electrolytes and troponin were unremarkable.  EKG showed bundle branch block with first-degree heart block but no other concerning findings.  Upon re-evaluation was still feeling dizzy after his meclizine.  Since he is unable to ambulate and given his risk factors we will transfer him for MRI and repeat evaluation.  If MRI is negative and patient is feeling back to baseline may be suitable to go home.  Will need to consider admission or PT/OT eval if patient is still unable to walk or is having significant symptoms.  This patient presents to the ED for concern of complaints listed in HPI, this involves an extensive number of treatment options, and is a complaint that carries with it a high risk of complications and morbidity. Disposition including potential need for admission considered.   Dispo: Transfer to [facility] ED  Additional history obtained from spouse Records reviewed Outpatient Clinic Notes The following labs were independently interpreted: Chemistry and show no acute abnormality I independently reviewed the following imaging with scope of interpretation limited to determining acute life threatening conditions related to emergency care: CT Head and agree with the radiologist interpretation with the following  exceptions: none I personally reviewed and interpreted cardiac monitoring: normal sinus rhythm  I personally reviewed and interpreted the pt's EKG: see above for interpretation  I have reviewed the patients home medications and made adjustments as needed Social Determinants of health:  Elderly  Portions of this note were generated with Scientist, clinical (histocompatibility and immunogenetics). Dictation errors may occur despite best attempts at proofreading.           Final Clinical Impression(s) / ED Diagnoses Final diagnoses:  Dizziness  Ataxia    Rx / DC Orders ED Discharge Orders     None  Rondel Baton, MD 12/06/22 [phone]

## 2022-12-06 NOTE — ED Notes (Signed)
Patient arrived from [place] via [place]. Patient is alert, oriented, and able to ambulate to stretcher. Vitals are stable. Will be here to get MRI.

## 2022-12-06 NOTE — ED Triage Notes (Signed)
Dizziness started today around dinner. Some weakness  Lives at [facility] assisted living  Been going on anf off for a few days per wife

## 2022-12-06 NOTE — ED Notes (Signed)
Pt. Denies pain but c/o weakness x 1 day, states he couldn't get up off the couch

## 2022-12-07 ENCOUNTER — Emergency Department (HOSPITAL_COMMUNITY): Payer: Medicare Other

## 2022-12-07 ENCOUNTER — Other Ambulatory Visit: Payer: Self-pay

## 2022-12-07 ENCOUNTER — Inpatient Hospital Stay (HOSPITAL_COMMUNITY): Payer: Medicare Other

## 2022-12-07 DIAGNOSIS — E785 Hyperlipidemia, unspecified: Secondary | ICD-10-CM | POA: Diagnosis present

## 2022-12-07 DIAGNOSIS — R4181 Age-related cognitive decline: Secondary | ICD-10-CM | POA: Diagnosis present

## 2022-12-07 DIAGNOSIS — I2510 Atherosclerotic heart disease of native coronary artery without angina pectoris: Secondary | ICD-10-CM | POA: Diagnosis present

## 2022-12-07 DIAGNOSIS — I6389 Other cerebral infarction: Secondary | ICD-10-CM | POA: Diagnosis not present

## 2022-12-07 DIAGNOSIS — Z87891 Personal history of nicotine dependence: Secondary | ICD-10-CM | POA: Diagnosis not present

## 2022-12-07 DIAGNOSIS — Z95828 Presence of other vascular implants and grafts: Secondary | ICD-10-CM | POA: Diagnosis not present

## 2022-12-07 DIAGNOSIS — Z7902 Long term (current) use of antithrombotics/antiplatelets: Secondary | ICD-10-CM | POA: Diagnosis not present

## 2022-12-07 DIAGNOSIS — H9192 Unspecified hearing loss, left ear: Secondary | ICD-10-CM | POA: Diagnosis present

## 2022-12-07 DIAGNOSIS — Z79899 Other long term (current) drug therapy: Secondary | ICD-10-CM | POA: Diagnosis not present

## 2022-12-07 DIAGNOSIS — J439 Emphysema, unspecified: Secondary | ICD-10-CM | POA: Diagnosis present

## 2022-12-07 DIAGNOSIS — I441 Atrioventricular block, second degree: Secondary | ICD-10-CM | POA: Diagnosis present

## 2022-12-07 DIAGNOSIS — R29701 NIHSS score 1: Secondary | ICD-10-CM | POA: Diagnosis present

## 2022-12-07 DIAGNOSIS — Z888 Allergy status to other drugs, medicaments and biological substances status: Secondary | ICD-10-CM | POA: Diagnosis not present

## 2022-12-07 DIAGNOSIS — R42 Dizziness and giddiness: Secondary | ICD-10-CM | POA: Diagnosis present

## 2022-12-07 DIAGNOSIS — Z5329 Procedure and treatment not carried out because of patient's decision for other reasons: Secondary | ICD-10-CM | POA: Diagnosis present

## 2022-12-07 DIAGNOSIS — Z7982 Long term (current) use of aspirin: Secondary | ICD-10-CM | POA: Diagnosis not present

## 2022-12-07 DIAGNOSIS — I63443 Cerebral infarction due to embolism of bilateral cerebellar arteries: Secondary | ICD-10-CM | POA: Diagnosis present

## 2022-12-07 DIAGNOSIS — N400 Enlarged prostate without lower urinary tract symptoms: Secondary | ICD-10-CM | POA: Diagnosis present

## 2022-12-07 DIAGNOSIS — Z85828 Personal history of other malignant neoplasm of skin: Secondary | ICD-10-CM | POA: Diagnosis not present

## 2022-12-07 DIAGNOSIS — Z882 Allergy status to sulfonamides status: Secondary | ICD-10-CM | POA: Diagnosis not present

## 2022-12-07 DIAGNOSIS — I10 Essential (primary) hypertension: Secondary | ICD-10-CM | POA: Diagnosis present

## 2022-12-07 LAB — CBC
HCT: 43 % (ref 39.0–52.0)
Hemoglobin: 14.4 g/dL (ref 13.0–17.0)
MCH: 31 pg (ref 26.0–34.0)
MCHC: 33.5 g/dL (ref 30.0–36.0)
MCV: 92.5 fL (ref 80.0–100.0)
Platelets: 255 10*3/uL (ref 150–400)
RBC: 4.65 MIL/uL (ref 4.22–5.81)
RDW: 13.3 % (ref 11.5–15.5)
WBC: 13.4 10*3/uL — ABNORMAL HIGH (ref 4.0–10.5)
nRBC: 0 % (ref 0.0–0.2)

## 2022-12-07 LAB — BASIC METABOLIC PANEL
Anion gap: 14 (ref 5–15)
BUN: 13 mg/dL (ref 8–23)
CO2: 24 mmol/L (ref 22–32)
Calcium: 9.2 mg/dL (ref 8.9–10.3)
Chloride: 98 mmol/L (ref 98–111)
Creatinine, Ser: 0.68 mg/dL (ref 0.61–1.24)
GFR, Estimated: >60 mL/min (ref >60)
Glucose, Bld: 90 mg/dL (ref 70–99)
Potassium: 4.1 mmol/L (ref 3.5–5.1)
Sodium: 136 mmol/L (ref 135–145)

## 2022-12-07 LAB — LIPID PANEL
Cholesterol: 182 mg/dL (ref 0–200)
HDL: 76 mg/dL (ref >40)
LDL Cholesterol: 97 mg/dL (ref 0–99)
Total CHOL/HDL Ratio: 2.4 {ratio}
Triglycerides: 46 mg/dL (ref <150)
VLDL: 9 mg/dL (ref 0–40)

## 2022-12-07 LAB — ECHOCARDIOGRAM COMPLETE
AR max vel: 2.54 cm2
AV Area VTI: 2.63 cm2
AV Area mean vel: 2.48 cm2
AV Mean grad: 4 mm[Hg]
AV Peak grad: 6.9 mm[Hg]
Ao pk vel: 1.31 m/s
Area-P 1/2: 2.52 cm2
Est EF: >75
S' Lateral: 2.2 cm
Weight: 3008.84 [oz_av]

## 2022-12-07 LAB — TROPONIN I (HIGH SENSITIVITY): Troponin I (High Sensitivity): 13 ng/L (ref <18)

## 2022-12-07 LAB — TSH: TSH: 1.233 u[IU]/mL (ref 0.350–4.500)

## 2022-12-07 LAB — HEMOGLOBIN A1C
Hgb A1c MFr Bld: 5.4 % (ref 4.8–5.6)
Mean Plasma Glucose: 108.28 mg/dL

## 2022-12-07 LAB — PHOSPHORUS: Phosphorus: 3.8 mg/dL (ref 2.5–4.6)

## 2022-12-07 LAB — VITAMIN B12: Vitamin B-12: 485 pg/mL (ref 180–914)

## 2022-12-07 LAB — HIV ANTIBODY (ROUTINE TESTING W REFLEX): HIV Screen 4th Generation wRfx: NONREACTIVE

## 2022-12-07 LAB — MAGNESIUM: Magnesium: 2.2 mg/dL (ref 1.7–2.4)

## 2022-12-07 LAB — RPR: RPR Ser Ql: NONREACTIVE

## 2022-12-07 MED ORDER — THIAMINE MONONITRATE 100 MG PO TABS: 100.0000 mg | ORAL_TABLET | Freq: Every day | ORAL | Status: DC

## 2022-12-07 MED ORDER — ACETAMINOPHEN 500 MG PO TABS: 1000.0000 mg | ORAL_TABLET | Freq: Four times a day (QID) | ORAL | Status: DC | PRN

## 2022-12-07 MED ORDER — LORAZEPAM 0.5 MG PO TABS: 0.5000 mg | ORAL_TABLET | Freq: Four times a day (QID) | ORAL | Status: DC | PRN

## 2022-12-07 MED ORDER — CLOPIDOGREL BISULFATE 75 MG PO TABS
75.0000 mg | ORAL_TABLET | Freq: Every day | ORAL | Status: DC
  Filled 2022-12-07: qty 1

## 2022-12-07 MED ORDER — SODIUM CHLORIDE 0.9% FLUSH
3.0000 mL | Freq: Two times a day (BID) | INTRAVENOUS | Status: DC
  Administered 2022-12-07: 3 mL via INTRAVENOUS

## 2022-12-07 MED ORDER — THIAMINE HCL 100 MG/ML IJ SOLN: 100.0000 mg | Freq: Every day | INTRAMUSCULAR | Status: DC

## 2022-12-07 MED ORDER — FOLIC ACID 1 MG PO TABS: 1.0000 mg | ORAL_TABLET | Freq: Every day | ORAL | Status: DC

## 2022-12-07 MED ORDER — PERFLUTREN LIPID MICROSPHERE
1.0000 mL | INTRAVENOUS | Status: AC | PRN
  Administered 2022-12-07: 2 mL via INTRAVENOUS

## 2022-12-07 MED ORDER — ADULT MULTIVITAMIN W/MINERALS CH: 1.0000 | ORAL_TABLET | Freq: Every day | ORAL | Status: DC

## 2022-12-07 MED ORDER — EZETIMIBE 10 MG PO TABS: 10.0000 mg | ORAL_TABLET | Freq: Every day | ORAL | Status: DC

## 2022-12-07 MED ORDER — FINASTERIDE 5 MG PO TABS: 5.0000 mg | ORAL_TABLET | Freq: Every day | ORAL | Status: DC

## 2022-12-07 MED ORDER — ENOXAPARIN SODIUM 40 MG/0.4ML IJ SOSY
40.0000 mg | PREFILLED_SYRINGE | INTRAMUSCULAR | Status: DC
  Administered 2022-12-07: 40 mg via SUBCUTANEOUS
  Filled 2022-12-07: qty 0.4

## 2022-12-07 MED ORDER — ASPIRIN 81 MG PO CHEW
81.0000 mg | CHEWABLE_TABLET | Freq: Every day | ORAL | Status: DC
  Filled 2022-12-07: qty 1

## 2022-12-07 MED ORDER — STROKE: EARLY STAGES OF RECOVERY BOOK: Freq: Once | Status: DC

## 2022-12-07 MED ORDER — IOHEXOL 350 MG/ML SOLN
75.0000 mL | Freq: Once | INTRAVENOUS | Status: AC | PRN
  Administered 2022-12-07: 75 mL via INTRAVENOUS

## 2022-12-07 MED ORDER — ROSUVASTATIN CALCIUM 5 MG PO TABS: 5.0000 mg | ORAL_TABLET | ORAL | Status: DC

## 2022-12-07 MED ORDER — ASPIRIN 81 MG PO CHEW
324.0000 mg | CHEWABLE_TABLET | Freq: Once | ORAL | Status: DC
  Filled 2022-12-07: qty 4

## 2022-12-07 MED ORDER — ONDANSETRON HCL 4 MG/2ML IJ SOLN: 4.0000 mg | Freq: Four times a day (QID) | INTRAMUSCULAR | Status: DC | PRN

## 2022-12-07 NOTE — Progress Notes (Signed)
Patient received to unit , oriented to room and attending notified

## 2022-12-07 NOTE — Evaluation (Signed)
Speech Language Pathology Evaluation Patient Details Name: Roger Benjamin MRN: [id number] DOB: [birth year] Today's Date: 12/07/2022 Time: 8295-6213 SLP Time Calculation (min) (ACUTE ONLY): 12 min  Problem List:  Patient Active Problem List   Diagnosis Date Noted   Acute arterial ischemic stroke, multifocal, mult vascular territories (HCC) 12/07/2022   Left carotid artery stenosis 09/23/2019   Past Medical History:  Past Medical History:  Diagnosis Date   Carotid artery stenosis    Hypertension    Unsure is on Metoprolol   Skin cancer    Past Surgical History:  Past Surgical History:  Procedure Laterality Date   ENDARTERECTOMY Left 09/23/2019   Procedure: ENDARTERECTOMY LEFT CAROTID with PATCH ANGIOPLASTY;  Surgeon: Larina Earthly, MD;  Location: [facility] OR;  Service: Vascular;  Laterality: Left;   FRACTURE SURGERY Left    leg   SKIN CANCER EXCISION     TONSILLECTOMY     as a child   HPI:  Roger Benjamin is an 89 yo male presenting to [facility] ED 10/10 with 1 month history of brief, minutes long episodes of dizziness and LE weakness, worsening in the past 24 hours. MRI Brain with patchy small volume ischemic infarcts involving the bilateral cerebral hemispheres and pons. He endorses a history of memory deficits at baseline. PMH includes carotid stenosis s/p L CEA, HTN, HLD, COPD, ILD   Assessment / Plan / Recommendation Clinical Impression  Pt reports history of memory decline at baseline. He states that he lives alone and handles all ADLs independently. Pt scored 19/30 on the SLUMS characterized by deficits related to memory (retreival > storage) and sustained attention. At times, pt produced phonemic paraphasias with more complex tasks, although this was not as frequently noted during spontaenous speech. He does present with mild dysarthria. Suspect pt may benefit from increased supervision upon d/c and ongoing SLP f/u to target deficits listed above. Will continue to follow.    SLP  Assessment  SLP Recommendation/Assessment: Patient needs continued Speech Lanaguage Pathology Services SLP Visit Diagnosis: Cognitive communication deficit (R41.841);Dysarthria and anarthria (R47.1)    Recommendations for follow up therapy are one component of a multi-disciplinary discharge planning process, led by the attending physician.  Recommendations may be updated based on patient status, additional functional criteria and insurance authorization.    Follow Up Recommendations  Home health SLP    Assistance Recommended at Discharge  Intermittent Supervision/Assistance  Functional Status Assessment Patient has had a recent decline in their functional status and demonstrates the ability to make significant improvements in function in a reasonable and predictable amount of time.  Frequency and Duration min 2x/week  1 week      SLP Evaluation Cognition  Overall Cognitive Status: Impaired/Different from baseline Arousal/Alertness: Awake/alert Orientation Level: Oriented X4 Attention: Sustained Sustained Attention: Impaired Sustained Attention Impairment: Verbal basic Memory: Impaired Memory Impairment: Retrieval deficit Awareness: Appears intact Problem Solving: Appears intact       Comprehension  Auditory Comprehension Overall Auditory Comprehension: Appears within functional limits for tasks assessed    Expression Expression Primary Mode of Expression: Verbal Verbal Expression Overall Verbal Expression: Appears within functional limits for tasks assessed   Oral / Motor  Oral Motor/Sensory Function Overall Oral Motor/Sensory Function: Within functional limits Motor Speech Overall Motor Speech: Impaired Respiration: Within functional limits Phonation: Normal Resonance: Within functional limits Articulation: Impaired Level of Impairment: Conversation Intelligibility: Intelligibility reduced Conversation: 75-100% accurate            [name], M.A.,  CF-SLP Speech Language  Pathology, Acute Rehabilitation Services  Secure Chat preferred [phone]  12/07/2022, 3:54 PM

## 2022-12-07 NOTE — Significant Event (Signed)
This nurse paged attending MD due to patient agitation and refusal to follow staff commands, patient stating that he is going home-daughter at bedside and states that she is going to take patient home because she doesn't want patient tied down. Bedside nurses aware and are in room with patient, MD paged to bedside nurses phone due to patient stating he is leaving AMA and refusal to cooperate with staff.

## 2022-12-07 NOTE — Care Plan (Signed)
This 89 yrs old Male with hx. of carotid stenosis,  status post left CEA, hypertension, hyperlipidemia, COPD, ILD, who presented in the ED for the symptoms of vertigo and MRI evaluation to rule out stroke.Patient reports approximately 1 month history of very brief, few minute long episodes of dizziness, with room spinning.  These are not positional, denies lightheadedness.  Additionally he reports that he has had weakness in his legs, worse in the left side.  Symptoms especially worse in the past 24 hours, unable to get up out of bed. denies any falls or head injury.  MRI showed multiple acute ischemic strokes in multiple vascular territories.  Patient was evaluated by neurology recommended stroke workup.  Patient started on aspirin Lipitor.  Patient was seen and examined at bedside.

## 2022-12-07 NOTE — Progress Notes (Signed)
Pt has left AMA, has left with daughter.

## 2022-12-07 NOTE — ED Notes (Signed)
Pt complaining about being in hallway; asking this RN if there is anywhere more comfortable and warmer.  Pt offered additional blankets.  Pt further asking about how long for MRI results.  This RN explained it can take awhile but is difficult to provide an exact timeframe.

## 2022-12-07 NOTE — Progress Notes (Signed)
Md made aware of patient leaving AMA, daughter has decided to take patient home all risk have been explained to patient and daughter, patient is very agitated and often  times threatening violence against staff, patient is up in room without any cloths and refusing to allow staff to help , MD and charge nurse made aware , IV and telemetry removed

## 2022-12-07 NOTE — ED Notes (Signed)
Pt assisted to restroom by tech and this RN.

## 2022-12-07 NOTE — ED Notes (Signed)
Roger Benjamin (Wife): [phone]

## 2022-12-07 NOTE — Progress Notes (Signed)
Patient is agitated and wanting per patient and daughter at bedside to leave AMA, charge nurse and attending made aware , see orders

## 2022-12-07 NOTE — ED Notes (Signed)
Pt provided with additional blankets by staff and is now resting comfortably.

## 2022-12-07 NOTE — ED Provider Notes (Signed)
[facility] with concerns for [facility]iness.  Patient and wife report increasing instability with walking specifically over the last 6 weeks.  He is now walking with a pain.  Wife describes a wide-based gait.  Patient describes intermittent episodes of dizziness that he states lasted several minutes.  Wife thought he was having vertigo.  He was seen and evaluated.  Recommended MRI and was transferred to [facility].  Currently without complaint.  3:32 AM MRI with multiple cerebellar infarcts, acute.  Discussed with neurology.  Will plan to admit for stroke workup.  Patient has remained stable in the ER.   Shon Baton, MD 12/07/22 [phone]

## 2022-12-07 NOTE — Evaluation (Signed)
Clinical/Bedside Swallow Evaluation Patient Details  Name: Roger Benjamin MRN: [id number] Date of Birth: [birth year]  Today's Date: 12/07/2022 Time: SLP Start Time (ACUTE ONLY): 1515 SLP Stop Time (ACUTE ONLY): 1525 SLP Time Calculation (min) (ACUTE ONLY): 10 min  Past Medical History:  Past Medical History:  Diagnosis Date   Carotid artery stenosis    Hypertension    Unsure is on Metoprolol   Skin cancer    Past Surgical History:  Past Surgical History:  Procedure Laterality Date   ENDARTERECTOMY Left 09/23/2019   Procedure: ENDARTERECTOMY LEFT CAROTID with PATCH ANGIOPLASTY;  Surgeon: Larina Earthly, MD;  Location: [facility] OR;  Service: Vascular;  Laterality: Left;   FRACTURE SURGERY Left    leg   SKIN CANCER EXCISION     TONSILLECTOMY     as a child   HPI:  Roger Benjamin is an 89 yo male presenting to [facility] ED 10/10 with 1 month history of brief, minutes long episodes of dizziness and LE weakness, worsening in the past 24 hours. MRI Brain with patchy small volume ischemic infarcts involving the bilateral cerebral hemispheres and pons. PMH includes carotid stenosis s/p L CEA, HTN, HLD, COPD, ILD    Assessment / Plan / Recommendation  Clinical Impression  Pt reports no history of difficulty swallowing or esophageal reflux, although does state that he frequently gags when taking pills. Oral motor exam WFL. Observed pt with trials of thin liquids via straw, which resulted in reduced oral control and an immediate cough. He states he does not typically use straws and no further s/s of aspiraiton noted when prompted to initiate sips via cup only. Trials of purees and solids with no signs clinically concerning for dysphagia or aspiration. Recommend initiating diet of regular texture solids with thin liquids. Pt may benefit from meds given whole in puree PRN. Will follow briefly. SLP Visit Diagnosis: Dysphagia, unspecified (R13.10)    Aspiration Risk  Mild aspiration risk    Diet  Recommendation Regular;Thin liquid    Liquid Administration via: Cup;Straw Medication Administration: Whole meds with liquid Supervision: Patient able to self feed;Intermittent supervision to cue for compensatory strategies Compensations: Slow rate;Small sips/bites Postural Changes: Seated upright at 90 degrees    Other  Recommendations Oral Care Recommendations: Oral care BID    Recommendations for follow up therapy are one component of a multi-disciplinary discharge planning process, led by the attending physician.  Recommendations may be updated based on patient status, additional functional criteria and insurance authorization.  Follow up Recommendations No SLP follow up      Assistance Recommended at Discharge    Functional Status Assessment Patient has had a recent decline in their functional status and demonstrates the ability to make significant improvements in function in a reasonable and predictable amount of time.  Frequency and Duration min 2x/week  1 week       Prognosis Prognosis for improved oropharyngeal function: Good Barriers to Reach Goals: Cognitive deficits      Swallow Study   General HPI: Roger Benjamin is an 89 yo male presenting to [facility] ED 10/10 with 1 month history of brief, minutes long episodes of dizziness and LE weakness, worsening in the past 24 hours. MRI Brain with patchy small volume ischemic infarcts involving the bilateral cerebral hemispheres and pons. PMH includes carotid stenosis s/p L CEA, HTN, HLD, COPD, ILD Type of Study: Bedside Swallow Evaluation Previous Swallow Assessment: none in chart Diet Prior to this Study: NPO Temperature Spikes Noted: No Respiratory  Status: Room air History of Recent Intubation: No Behavior/Cognition: Alert;Cooperative;Pleasant mood Oral Cavity Assessment: Within Functional Limits Oral Care Completed by SLP: No Oral Cavity - Dentition: Adequate natural dentition Vision: Functional for  self-feeding Self-Feeding Abilities: Able to feed self Patient Positioning: Upright in bed Baseline Vocal Quality: Normal Volitional Cough: Strong Volitional Swallow: Able to elicit    Oral/Motor/Sensory Function Overall Oral Motor/Sensory Function: Within functional limits   Ice Chips Ice chips: Not tested   Thin Liquid Thin Liquid: Impaired Presentation: Straw;Cup Pharyngeal  Phase Impairments: Cough - Immediate    Nectar Thick Nectar Thick Liquid: Not tested   Honey Thick Honey Thick Liquid: Not tested   Puree Puree: Within functional limits Presentation: Spoon;Self Fed   Solid     Solid: Within functional limits Presentation: Self Fed      [name], M.A., CF-SLP Speech Language Pathology, Acute Rehabilitation Services  Secure Chat preferred [phone]  12/07/2022,3:49 PM

## 2022-12-07 NOTE — Progress Notes (Signed)
SLP Cancellation Note  Patient Details Name: Roger Benjamin MRN: [id number] DOB: 1931-03-14   Cancelled treatment:       Reason Eval/Treat Not Completed: Patient at procedure or test/unavailable (echo). Will continue attempts to f/u.    [name], M.A., CF-SLP Speech Language Pathology, Acute Rehabilitation Services  Secure Chat preferred [phone]  12/07/2022, 2:16 PM

## 2022-12-07 NOTE — ED Notes (Signed)
Pt resting in bed with eyes closed; respirations even and unlabored with no distress noted.

## 2022-12-07 NOTE — Consult Note (Signed)
NEUROLOGY CONSULT NOTE   Date of service: December 07, 2022 Patient Name: Roger Benjamin MRN:  [id number] DOB:  January 30, 1932 Chief Complaint: "dizziness" Requesting Provider: Shon Baton, MD  History of Present Illness  Roger Benjamin is a 89 y.o. man with past medical history significant for hypertension, hyperlipidemia (statin intolerant), emphysema, mild COPD, ILD, coronary artery disease, aortic atherosclerotic disease, left carotid endarterectomy, trifascicular block  He has had several different doctors appointments for worsening generalized weakness starting earlier this year.  He notes he used to be able to walk 1 mile daily but has had to cut back to half a mile daily.  For this issue first visited cardiology in late July and his blood pressure medications were discontinued, echo was completed which revealed an EF of 55 to 60%, mild concentric LVH and grade 1 diastolic dysfunction for which losartan was restarted.  He also had a event monitor and was referred to electrophysiology due to trifascicular block.  At that visit he was noted to have desaturation with ambulation to 86%.  He was referred to pulmonology who noted that he was not having desaturation with ambulation at that evaluation, that he had a positive sleep study but was not interested in CPAP; he was referred to pulmonary rehab but ultimately decided not to pursue that.  While walking on 10/10 he developed dizziness which was more persistent/severe and revealed acute infarcts for which neurology was consulted   LKW: 10/10 about noon Modified rankin score: 0-Completely asymptomatic and back to baseline post- stroke IV Thrombolysis: No, out of the window EVT: No, exam not consistent with LVO   ROS  Comprehensive ROS performed and pertinent positives documented in HPI, with caveat the patient seems to have short-term memory loss   Past History   Past Medical History:  Diagnosis Date   Carotid artery stenosis     Hypertension    Unsure is on Metoprolol   Skin cancer     Past Surgical History:  Procedure Laterality Date   ENDARTERECTOMY Left 09/23/2019   Procedure: ENDARTERECTOMY LEFT CAROTID with PATCH ANGIOPLASTY;  Surgeon: Larina Earthly, MD;  Location: [facility] OR;  Service: Vascular;  Laterality: Left;   FRACTURE SURGERY Left    leg   SKIN CANCER EXCISION     TONSILLECTOMY     as a child    Family History: History reviewed. No pertinent family history.  Social History  reports that he quit smoking about 42 years ago. His smoking use included cigarettes. He started smoking about 72 years ago. He has a 90 pack-year smoking history. He has never used smokeless tobacco. He reports current alcohol use of about 7.0 standard drinks of alcohol per week. He reports that he does not use drugs.  Allergies  Allergen Reactions   Sulfa Antibiotics     Other reaction(s): stomach upset   Tape     Needs paper tape    Medications  No current facility-administered medications for this encounter.  Current Outpatient Medications:    aspirin EC 81 MG tablet, Take 81 mg by mouth every other day. Swallow whole., Disp: , Rfl:    Cyanocobalamin (VITAMIN B 12 PO), Take 1 tablet by mouth daily., Disp: , Rfl:    finasteride (PROSCAR) 5 MG tablet, Take 5 mg by mouth daily., Disp: , Rfl:    losartan (COZAAR) 25 MG tablet, Take 0.5 tablets (12.5 mg total) by mouth at bedtime., Disp: 45 tablet, Rfl: 3   metoprolol succinate (TOPROL-XL) 25 MG  24 hr tablet, Take 12.5 mg by mouth daily., Disp: , Rfl:    Multiple Vitamins-Minerals (MULTIVITAMIN WITH MINERALS) tablet, Take 1 tablet by mouth daily., Disp: , Rfl:    rosuvastatin (CRESTOR) 5 MG tablet, Take 5 mg by mouth once a week., Disp: , Rfl:    tamsulosin (FLOMAX) 0.4 MG CAPS capsule, Take 0.4 mg by mouth daily., Disp: , Rfl:   Vitals   Vitals:   12/06/22 2130 12/06/22 2145 12/06/22 2200 12/06/22 2323  BP: (!) 140/89 138/85 131/79 (!) 154/98  Pulse:    71  Resp:     20  Temp:    97.9 F (36.6 C)  TempSrc:    Oral  SpO2:    95%    There is no height or weight on file to calculate BMI.  Physical Exam   Constitutional: Appears well-developed and well-nourished.  Psych: Affect appropriate to situation, calm and cooperative Eyes: No scleral injection.  HENT: No OP obstruction.  Head: Normocephalic.  Cardiovascular: Normal rate and regular rhythm.  Respiratory: Effort normal, non-labored breathing at rest but quickly short of breath with confrontational strength testing.  GI: Soft.  No distension. There is no tenderness.  Skin: WDI.   Neurologic Examination   Physical Exam  Constitutional: Appears well-developed and well-nourished.  Psych: Affect appropriate to situation Eyes: No scleral injection HENT: No OP obstrucion MSK: no joint deformities.  Cardiovascular: Normal rate and regular rhythm.  Respiratory: Effort normal, non-labored breathing GI: Soft.  No distension. There is no tenderness.  Skin: WDI  Neuro: Mental Status: Patient is awake, alert, oriented to person, place, but reports month is September, age, but not situation (perseverates on irregular heart rate rather than being able to recall he has been told he has a stroke) No signs of aphasia or neglect Cranial Nerves: II: Visual Fields are full. Pupils are round, and reactive to light, left 0.5 mm larger than the right but both reactive.   III,IV, VI: EOMI without ptosis or diploplia.  Saccadic pursuits V: Facial sensation is symmetric to temperature VII: Facial movement with very subtle right nasolabial fold flattening (too mild to score on NIH) VIII: hearing is intact to voice (chronic left ear hearing loss) X: Uvula elevates symmetrically XI: Shoulder shrug is symmetric. XII: tongue is midline without atrophy or fasciculations.  Motor: Tone is normal. Bulk is normal. 5/5 strength was present in all four extremities.  Sensory: Sensation is symmetric to light touch and  temperature in the arms and legs. Deep Tendon Reflexes: 3+ and symmetric in the biceps and patellae.  Cerebellar: FNF and HKS are intact bilaterally  NIHSS total 1 Score breakdown: 1 for stating incorrect month   Labs    Basic Metabolic Panel: Recent Labs  Lab 12/06/22 2030  NA 132*  K 4.2  CL 99  CO2 26  GLUCOSE 120*  BUN 23  CREATININE 0.90  CALCIUM 9.4    CBC: Recent Labs  Lab 12/06/22 2030  WBC 13.9*  HGB 15.4  HCT 44.6  MCV 92.0  PLT 255    Coagulation Studies: No results for input(s): "LABPROT", "INR" in the last 72 hours.    CT Head without contrast(Personally reviewed): 1. No acute intracranial abnormality. 2. Stable atrophy and chronic small vessel ischemia.  MRI Brain(Personally reviewed): 1. Patchy small volume acute ischemic infarcts involving the bilateral cerebral hemispheres and pons as above. No associated hemorrhage or significant mass effect. 2. Underlying age-related cerebral atrophy with moderately advanced chronic microvascular ischemic disease, with  multiple chronic lacunar infarcts about the bilateral basal ganglia and thalami.  CXR Severe emphysema. No superimposed acute cardiopulmonary process.  CTA head / neck 08/14/21 Plaque at the left greater than right subclavian artery origins with progression of stenosis since 2021. Improved flow within the extracranial right vertebral artery. Plaque along the right common and internal carotid arteries with less than 50% stenosis. Post left endarterectomy without ICA stenosis.  ECHO 10/16/2022  1. Left ventricular ejection fraction, by estimation, is 55 to 60%. The  left ventricle has normal function. The left ventricle has no regional  wall motion abnormalities. There is mild concentric left ventricular  hypertrophy. Left ventricular diastolic  parameters are consistent with Grade I diastolic dysfunction (impaired  relaxation).   2. Right ventricular systolic function is normal.  The right ventricular  size is normal. Tricuspid regurgitation signal is inadequate for assessing  PA pressure.   3. The mitral valve is normal in structure. Mild mitral valve  regurgitation. No evidence of mitral stenosis.   4. The aortic valve is normal in structure. Aortic valve regurgitation is  not visualized. No aortic stenosis is present.   5. There is mild dilatation of the aortic root, measuring 37 mm.   6. The inferior vena cava is dilated in size with >50% respiratory  variability, suggesting right atrial pressure of 8 mmHg.   Carotid US 05/24/22 Right Carotid: Velocities in the right ICA are consistent with a 1-39% stenosis.  Left Carotid: Velocities in the left ICA are consistent with a 1-39% stenosis.               The ECA appears >50% stenosed.  Vertebrals:   Left vertebral artery demonstrates antegrade flow.  Right vertebral artery demonstrates retrograde flow.  Subclavians:  Right subclavian artery was stenotic.  Normal flow hemodynamics were seen in the left subclavian artery.   10/09/2022 EVENTS: -Second Degree AV Block-Mobitz I (Wenckebach) was present.  -Isolated SVEs were rare (<1.0%), SVE Couplets were rare (<1.0%), and SVE Triplets were rare (<1.0%).  -Isolated VEs were rare (<1.0%), VE Couplets were rare (<1.0%), and no VE Triplets were present. No atrial fibrillation, sustained ventricular tachyarrhythmias, or bradyarrhythmias were detected. No patient triggered events. Given trifascicular block and symptoms, patient has an EP referral in September.  Impression   Roger Benjamin is a 89 y.o. male presenting with multifocal strokes in multiple vascular distributions concerning for central embolic source.  Given his intermittent dizziness I am concerned about arrhythmia.  He did have a cardiac monitor already earlier this year that was negative; may therefore benefit from loop recorder implantation if no clear indication for chronic anticoagulation determined  by other workup below.  Suspect his poor memory might be referable to chronic microvascular disease and sleep deprivation in the setting of being in the ED but will also send basic reversible causes of cognitive decline labs  Recommendations   # Multifocal stroke in multiple vascular distributions - Stroke labs  HgbA1c, fasting lipid panel - CTA head and neck - Frequent neuro checks - Echocardiogram - Prophylactic therapy-Antiplatelet med: Aspirin - dose 325mg  PO or 300mg  PR, followed by 81 mg daily - Holding off on Plavix as suspect a central embolic source such as occult atrial fibrillation which would need anticoagulation - Risk factor modification, discussed with patient repeat testing - Telemetry monitoring; 30 day event monitor on discharge if no arrythmias captured  - Blood pressure goal   - Permissive hypertension to 220/120 due to acute stroke for 24  to 48 hours - PT consult, OT consult, Speech consult - Stroke team to follow  # Poor memory - B12, thiamine, RPR, HIV  Thank you for involving [place] in the care of this patient ______________________________________________________________________    Nunzio Cory MD-PhD [facility] [phone]

## 2022-12-07 NOTE — ED Notes (Signed)
Pt trying to get up again at this time; states he needs to use the restroom.  This RN and another RN to assist pt to restroom.

## 2022-12-07 NOTE — Progress Notes (Addendum)
STROKE TEAM PROGRESS NOTE   BRIEF HPI Mr. Roger Benjamin is a 89 y.o. male with past medical history significant for hypertension, hyperlipidemia (statin intolerant), emphysema, mild COPD, ILD, coronary artery disease, aortic atherosclerotic disease, left carotid endarterectomy, trifascicular block. While walking on 10/10 he developed dizziness which was more persistent/severe and revealed acute infarcts for which neurology was consulted   MRI brain here demonstrating acute multifocal strokes including left parietal, right frontal corona radiata, pontine.  Also seen are chronic lacunar infarcts in bilateral basal ganglia and thalamus.   INTERIM HISTORY/SUBJECTIVE  On exam, no family at bedside. Patient confused to situation but knows he is in the hospital, city, state, his age, the current month. Follows commands, no aphasia. No focal weakness seen. NIH: 0. Denied dizziness, nausea, headache, SOB.   OBJECTIVE  CBC    Component Value Date/Time   WBC 13.4 (H) 12/07/2022 0753   RBC 4.65 12/07/2022 0753   HGB 14.4 12/07/2022 0753   HGB 14.7 09/26/2022 1654   HCT 43.0 12/07/2022 0753   HCT 43.8 09/26/2022 1654   PLT 255 12/07/2022 0753   PLT 258 09/26/2022 1654   MCV 92.5 12/07/2022 0753   MCV 93 09/26/2022 1654   MCH 31.0 12/07/2022 0753   MCHC 33.5 12/07/2022 0753   RDW 13.3 12/07/2022 0753   RDW 12.6 09/26/2022 1654   LYMPHSABS 1.5 08/02/2019 1645   MONOABS 1.6 (H) 08/02/2019 1645   EOSABS 0.4 08/02/2019 1645   BASOSABS 0.1 08/02/2019 1645    BMET    Component Value Date/Time   NA 136 12/07/2022 0753   NA 136 09/26/2022 1654   K 4.1 12/07/2022 0753   CL 98 12/07/2022 0753   CO2 24 12/07/2022 0753   GLUCOSE 90 12/07/2022 0753   BUN 13 12/07/2022 0753   BUN 14 09/26/2022 1654   CREATININE 0.68 12/07/2022 0753   CALCIUM 9.2 12/07/2022 0753   EGFR 89 09/26/2022 1654   GFRNONAA >60 12/07/2022 0753    IMAGING past 24 hours CT ANGIO HEAD NECK W WO CM  Result Date:  12/07/2022 CLINICAL DATA:  89 year old male with weakness, dizziness, vertigo, ataxia, patchy acute infarcts in both parietal lobes, right corona radiata. EXAM: CT ANGIOGRAPHY HEAD AND NECK WITH AND WITHOUT CONTRAST TECHNIQUE: Multidetector CT imaging of the head and neck was performed using the standard protocol during bolus administration of intravenous contrast. Multiplanar CT image reconstructions and MIPs were obtained to evaluate the vascular anatomy. Carotid stenosis measurements (when applicable) are obtained utilizing NASCET criteria, using the distal internal carotid diameter as the denominator. RADIATION DOSE REDUCTION: This exam was performed according to the departmental dose-optimization program which includes automated exposure control, adjustment of the mA and/or kV according to patient size and/or use of iterative reconstruction technique. CONTRAST:  75mL OMNIPAQUE IOHEXOL 350 MG/ML SOLN COMPARISON:  Brain MRI 0049 hours today. Head CT yesterday. CTA head and neck 08/14/2021 and earlier. FINDINGS: CTA NECK Skeleton: Cervical spine degeneration with multilevel ankylosis. TMJ degeneration. No acute osseous abnormality identified. Upper chest: Chronic emphysema, moderate to severe in the upper lobes. No superior mediastinal lymphadenopathy. Other neck: No acute finding. Aortic arch: Severe Calcified aortic atherosclerosis. 3 vessel arch. Right carotid system: Extensive brachiocephalic artery, right CCA, right carotid bifurcation, proximal right ICA and distal bulb level atherosclerosis is stable from last year. Hemodynamically significant stenosis at the distal bulb level numerically estimated at 60-65 % with respect to the distal vessel. Left carotid system: Less pronounced left CCA atherosclerosis and evidence of  chronic left carotid endarterectomy with no hemodynamically significant stenosis. Vertebral arteries: Severe proximal right subclavian artery atherosclerosis results in high-grade string  sign stenosis of the proximal subclavian artery redemonstrated on series 10, image 81. The vessel remains patent. Right vertebral artery origin is patent without significant stenosis despite calcified plaque. But there is asymmetric decreased enhancement of the right vertebral artery which remains patent to the skull base. Severe proximal left subclavian artery atherosclerosis also with high-grade stenosis due to complex plaque on series 8, image 160, numerically estimated at 80 % with respect to the distal vessel and not significantly changed from last year. Left vertebral artery origin calcified plaque but only mild left vertebral origin stenosis. Codominant left vertebral artery is patent with superior enhancement compared to the right side. CTA HEAD Posterior circulation: Patent distal vertebral arteries and vertebrobasilar junction with calcified plaque. Only mild right V4 stenosis. Patent PICA origins. Patent basilar artery with mild irregularity, mild basilar stenosis (series 12, image 26. Patent SCA and PCA origins. Fetal type right posterior communicating artery origin. Left PCA demonstrates new tandem severe stenoses since last year on series 13, image 24, but there is maintained distal left PCA enhancement. No proximal occlusion. Fetal type right PCA is stable and patent with only mild irregularity. Anterior circulation: Both ICA siphons are patent with calcified plaque. On the left there is only mild stenosis. No significant stenosis on the right. Normal right posterior communicating artery origin. Patent carotid termini. Patent MCA and ACA origins with dominant right A1, left A1 is diminutive. Normal anterior communicating artery. Mild to moderate bilateral A2 segment stenoses, proximal right A2 and distal left A2 on series 13, image 21. This is stable. Left MCA M1 segment and bifurcation are patent without stenosis. Right MCA M1 segment and trifurcation are patent without stenosis. Bilateral MCA  branches are stable with mild irregularity. Venous sinuses: Early contrast timing, not well evaluated. Anatomic variants: Fetal right PCA origin.  Dominant right ACA A1. Review of the MIP images confirms the above findings IMPRESSION: 1. Negative for emergent large vessel occlusion but positive for new Tandem Severe stenoses of the Left PCA since 2023 (P1 and P2 segment - series 13, image 24). AND Positive for underlying chronic Very Severe bilateral proximal Subclavian atherosclerosis and stenosis: - RIGHT SUBCLAVIAN ARTERY ORIGIN RADIOGRAPHIC STRING SIGN. -80% proximal Left Subclavian Artery atherosclerosis. Associated asymmetrically decreased enhancement of the Right Vertebral Artery in the neck, remains patent. Only mild superimposed Vertebral Artery and Basilar artery plaque and stenosis. 2. Previous Left carotid endarterectomy with no significant left carotid stenosis. Right ICA origin and bulb bulky calcified plaque with up to 65% stenosis. 3. Moderate chronic stenoses of the bilateral ACA A2 segments, stable. 4. Aortic Atherosclerosis (ICD10-I70.0) and Emphysema (ICD10-J43.9). Electronically Signed   By: Odessa Fleming M.D.   On: 12/07/2022 06:32   MR BRAIN WO CONTRAST  Result Date: 12/07/2022 CLINICAL DATA:  Initial evaluation for acute vertigo, ataxia. EXAM: MRI HEAD WITHOUT CONTRAST TECHNIQUE: Multiplanar, multiecho pulse sequences of the brain and surrounding structures were obtained without intravenous contrast. COMPARISON:  CT from 12/06/2022. FINDINGS: Brain: Examination degraded by motion. Generalized age-related cerebral atrophy. Patchy and confluent T2/FLAIR hyperintensity involving the periventricular and deep white matter as well as the pons, consistent with chronic small vessel ischemic disease, moderately advanced. Small remote left cerebellar infarct noted. Multiple chronic lacunar infarcts noted about the bilateral basal ganglia and thalami. Patchy small volume foci of restricted diffusion are  seen involving the bilateral cerebral hemispheres, consistent  with acute ischemic infarcts. The largest area of infarction measures approximately 8 mm at the left parietal lobe (series 2, image 46). Small volume involvement of the right frontal corona radiata (series 2, image 40). Small focus of acute ischemia noted within the pons as well (series 3, image 19). No associated hemorrhage or significant mass effect. Gray-white matter differentiation otherwise maintained. No acute intracranial hemorrhage. Few scattered chronic micro hemorrhages noted, nonspecific. No mass lesion, midline shift or mass effect. Ventricular prominence related to global parenchymal volume loss without hydrocephalus. No extra-axial fluid collection. Pituitary gland suprasellar region within normal limits. Vascular: Major intracranial vascular flow voids are maintained. Skull and upper cervical spine: Craniocervical junction within normal limits. Bone marrow signal intensity normal. No scalp soft tissue abnormality. Sinuses/Orbits: Prior bilateral ocular lens replacement. Mild scattered mucosal thickening noted about the ethmoidal air cells and maxillary sinuses. Trace bilateral mastoid effusions, of doubtful significance. Visualized nasopharynx unremarkable. Other: None. IMPRESSION: 1. Patchy small volume acute ischemic infarcts involving the bilateral cerebral hemispheres and pons as above. No associated hemorrhage or significant mass effect. 2. Underlying age-related cerebral atrophy with moderately advanced chronic microvascular ischemic disease, with multiple chronic lacunar infarcts about the bilateral basal ganglia and thalami. Electronically Signed   By: Rise Mu M.D.   On: 12/07/2022 02:49   CT Head Wo Contrast  Result Date: 12/06/2022 CLINICAL DATA:  Weakness and dizziness. EXAM: CT HEAD WITHOUT CONTRAST TECHNIQUE: Contiguous axial images were obtained from the base of the skull through the vertex without  intravenous contrast. RADIATION DOSE REDUCTION: This exam was performed according to the departmental dose-optimization program which includes automated exposure control, adjustment of the mA and/or kV according to patient size and/or use of iterative reconstruction technique. COMPARISON:  Head CT 08/14/2021 FINDINGS: Brain: Stable degree of atrophy and chronic small vessel ischemia. No acute hemorrhage, ischemia, hydrocephalus, extra-axial collection or mass lesion/mass effect. Vascular: Atherosclerosis of skullbase vasculature without hyperdense vessel or abnormal calcification. Skull: No fracture or focal lesion. Sinuses/Orbits: Mild chronic mucosal thickening of the ethmoid air cells. Occasional opacification of lower right mastoid air cells, chronic. No acute findings. Other: None. IMPRESSION: 1. No acute intracranial abnormality. 2. Stable atrophy and chronic small vessel ischemia. Electronically Signed   By: Narda Rutherford M.D.   On: 12/06/2022 22:34   DG Chest Portable 1 View  Result Date: 12/06/2022 CLINICAL DATA:  Dyspnea EXAM: PORTABLE CHEST 1 VIEW COMPARISON:  08/02/2019 FINDINGS: Coarsening of the pulmonary interstitium is chronic in leads to underlying severe emphysema better seen on CT examination of 02/23/2021. No superimposed confluent pulmonary infiltrate. No pneumothorax or pleural effusion. Cardiac size within normal limits. No acute bone abnormality. IMPRESSION: 1. Severe emphysema. No superimposed acute cardiopulmonary process. Electronically Signed   By: Helyn Numbers M.D.   On: 12/06/2022 21:53    Vitals:   12/06/22 2323 12/07/22 0517 12/07/22 0833 12/07/22 1115  BP: (!) 154/98  (!) 149/64 (!) 150/73  Pulse: 71  73 73  Resp: 20  20 18   Temp: 97.9 F (36.6 C)  98.2 F (36.8 C)   TempSrc: Oral  Oral   SpO2: 95%  98% 95%  Weight:  85.3 kg       PHYSICAL EXAM General:  Alert, well-nourished, well-developed patient in no acute distress Psych:  Mood and affect appropriate  for situation CV: Regular rate and rhythm on monitor Respiratory:  Regular, unlabored respirations on room air GI: Abdomen soft and nontender   NEURO:  Mental Status: AA&Ox3, patient is able  to give clear and coherent history Speech/Language: speech is without dysarthria or aphasia.  Naming, repetition, fluency, and comprehension intact.  Cranial Nerves:  II: PERRL. Visual fields full.  III, IV, VI: EOMI. Eyelids elevate symmetrically.  V: Sensation is intact to light touch and symmetrical to face.  VII: Face is symmetrical resting and smiling VIII: chronic left hearing loss IX, X: Palate elevates symmetrically. Phonation is normal.  NW:GNFAOZHY shrug 5/5. XII: tongue is midline without fasciculations. Motor: 5/5 in all extremities  Tone: is normal and bulk is normal Sensation- Intact to light touch bilaterally.  Coordination: FTN intact bilaterally, HKS: no ataxia in BLE.No drift.  Gait- deferred  ASSESSMENT/PLAN  Acute Ischemic Infarct:  bilateral cerebral hemispheres and pons Etiology:  likely cardioembolic, pending stroke workup  Code Stroke CT head: No acute abnormality. Small vessel disease. Atrophy.   MRI   Patchy small volume acute ischemic infarcts involving the bilateral cerebral hemispheres and pons No associated hemorrhage or significant mass effect. Underlying age-related cerebral atrophy with moderately advanced chronic microvascular ischemic disease, with multiple chronic lacunar infarcts about the bilateral basal ganglia and thalami.  2D Echo  PENDING 8/24: LVEF 55-60%, Grade I diastolic dysfunction Loop Recorder on discharge? Will speak with patient's family more about this over the weekend  LDL 97 HgbA1c 5.4 VTE prophylaxis - lovenox aspirin 81 mg daily prior to admission, now on aspirin 81 mg daily and clopidogrel 75 mg daily for 3 weeks and then plavix alone. If atrial fibrillation is seen during workup, we will likely change the plavix to Padaxa or  Brilinta.  Therapy recommendations:  Pending Disposition:  pending  Hypertension Home meds:  toprol-xl, losartan 25mg  Stable Blood Pressure Goal: BP less than 220/110 for another 24 hours, then gradually back to normotensive.   Hyperlipidemia Home meds:  crestor 5mg , resumed in hospital LDL 97 goal < 70 High intensity statin not indicated due to LDL near goal and patient's advanced age, high risk of myalgias and increased falls in elderly patient. Discussed dietary changes.  Continue statin at discharge  Diabetes type II, no history Home meds:  5.4 HgbA1c No results found for requested labs within last 1095 days., goal < 7.0 CBGs SSI  Tobacco Abuse Former Cigarette Smoker Smoked for 30 years   Other Stroke Risk Factors Advanced Age   Other Active Problems   Hospital day # 0   Pt seen by Neuro NP/APP and later by MD. Note/plan to be edited by MD as needed.    Lynnae January, DNP, AGACNP-BC [facility] Please use AMION for contact information & EPIC for messaging.  If echo is unremarkable will need TEE and loop monitor for suspicion of cardioembolic stroke.  DAPT for now as above.  Same-day note no charge.  To contact Stroke Continuity provider, please refer to [email or web address]. After hours, contact General Neurology

## 2022-12-07 NOTE — H&P (Addendum)
History and Physical    Roger Benjamin UJW:[id number] DOB: [birth year] DOA: 12/06/2022  PCP: Charlane Ferretti, DO   Patient coming from:  Tx from [facility] ED-ED    Chief Complaint:  Chief Complaint  Patient presents with   Dizziness    HPI:  Roger Benjamin is a 89 y.o. male with hx of carotid stenosis status post left CEA, hypertension, hyperlipidemia, COPD, ILD, who was transferred ED [facility] for symptoms of vertigo and MRI evaluation to rule out stroke.  On interview reports that he has had approximately 1 month history of very brief, few minute long episodes of dizziness, room spinning.  These are not positional, denies lightheadedness.  Additionally reports that he has had weakness in his legs, worse in the left side.  Symptoms especially worse in the past 24 hours, unable to get up out of bed. denies any falls or head injury.  Denies any headache, vision changes, speech changes, other weakness or numbness.  Had no known history of stroke prior.    Review of Systems:  ROS complete and negative except as marked above   Allergies  Allergen Reactions   Sulfa Antibiotics     Other reaction(s): stomach upset   Tape     Needs paper tape    Prior to Admission medications   Medication Sig Start Date End Date Taking? Authorizing Provider  aspirin EC 81 MG tablet Take 81 mg by mouth every other day. Swallow whole.    [provider]  Cyanocobalamin (VITAMIN B 12 PO) Take 1 tablet by mouth daily.    [provider]  finasteride (PROSCAR) 5 MG tablet Take 5 mg by mouth daily. 09/21/21   [provider]  losartan (COZAAR) 25 MG tablet Take 0.5 tablets (12.5 mg total) by mouth at bedtime. 10/17/22   Orbie Pyo, MD  metoprolol succinate (TOPROL-XL) 25 MG 24 hr tablet Take 12.5 mg by mouth daily.    [provider]  Multiple Vitamins-Minerals (MULTIVITAMIN WITH MINERALS) tablet Take 1 tablet by mouth daily.    [provider]  rosuvastatin (CRESTOR) 5 MG tablet Take 5 mg by mouth once a week. 09/21/21   [provider]  tamsulosin (FLOMAX) 0.4 MG CAPS capsule Take 0.4 mg by mouth daily. 10/09/22   [provider]    Past Medical History:  Diagnosis Date   Carotid artery stenosis    Hypertension    Unsure is on Metoprolol   Skin cancer     Past Surgical History:  Procedure Laterality Date   ENDARTERECTOMY Left 09/23/2019   Procedure: ENDARTERECTOMY LEFT CAROTID with PATCH ANGIOPLASTY;  Surgeon: Larina Earthly, MD;  Location: [facility] OR;  Service: Vascular;  Laterality: Left;   FRACTURE SURGERY Left    leg   SKIN CANCER EXCISION     TONSILLECTOMY     as a child     reports that he quit smoking about 42 years ago. His smoking use included cigarettes. He started smoking about 72 years ago. He has a 90 pack-year smoking history. He has never used smokeless tobacco. He reports current alcohol use of about 7.0 standard drinks of alcohol per week. He reports that he does not use drugs.  History reviewed. No pertinent family history.   Physical Exam: Vitals:   12/06/22 2145 12/06/22 2200 12/06/22 2323 12/07/22 0517  BP: 138/85 131/79 (!) 154/98   Pulse:   71   Resp:   20   Temp:  97.9 F (36.6 C)   TempSrc:   Oral   SpO2:   95%   Weight:    85.3 kg    Gen: Awake, alert, chronically ill, elderly, frail CV: Regular, normal S1, S2, 1/6 SEM Resp: Normal WOB, CTAB  Abd: Flat, normoactive, nontender MSK: Symmetric, no edema  Skin: No rashes or lesions to exposed skin  Neuro: Alert and interactive, oriented to person place.  Not to time (September).  Speech slightly dysarthric.  CN II through XII intact, minimal anisocoria with L > R although reactive.  Motor grossly 4-5 in the left upper and left lower extremity.  5 out of 5 on the right.  Sensation is intact and equal to fine touch Psych: euthymic, appropriate    Data review:   Labs reviewed, notable for:   Chemistries  unremarkable,  Trop negative, WBC 13  Micro:  Results for orders placed or performed in visit on 03/17/21  SARS Coronavirus 2 (TAT 6-24 hrs)     Status: None   Collection Time: 03/17/21 12:00 AM  Result Value Ref Range Status   SARS Coronavirus 2 RESULT: NEGATIVE  Final    Comment: RESULT: NEGATIVESARS-CoV-2 INTERPRETATION:A NEGATIVE  test result means that SARS-CoV-2 RNA was not present in the specimen above the limit of detection of this test. This does not preclude a possible SARS-CoV-2 infection and should not be used as the  sole basis for patient management decisions. Negative results must be combined with clinical observations, patient history, and epidemiological information. Optimum specimen types and timing for peak viral levels during infections caused by SARS-CoV-2  have not been determined. Collection of multiple specimens or types of specimens may be necessary to detect virus. Improper specimen collection and handling, sequence variability under primers/probes, or organism present below the limit of detection may  lead to false negative results. Positive and negative predictive values of testing are highly dependent on prevalence. False negative test results are more likely when prevalence of disease is high.The expected result is NEGATIVE.Fact S heet for  Healthcare Providers: [employer] Sheet for Patients: https://poole-freeman.org/ Reference Range - Negative     Imaging reviewed:  CT ANGIO HEAD NECK W WO CM  Result Date: 12/07/2022 CLINICAL DATA:  89 year old male with weakness, dizziness, vertigo, ataxia, patchy acute infarcts in both parietal lobes, right corona radiata. EXAM: CT ANGIOGRAPHY HEAD AND NECK WITH AND WITHOUT CONTRAST TECHNIQUE: Multidetector CT imaging of the head and neck was performed using the standard protocol during bolus administration of intravenous contrast. Multiplanar CT image reconstructions and  MIPs were obtained to evaluate the vascular anatomy. Carotid stenosis measurements (when applicable) are obtained utilizing NASCET criteria, using the distal internal carotid diameter as the denominator. RADIATION DOSE REDUCTION: This exam was performed according to the departmental dose-optimization program which includes automated exposure control, adjustment of the mA and/or kV according to patient size and/or use of iterative reconstruction technique. CONTRAST:  75mL OMNIPAQUE IOHEXOL 350 MG/ML SOLN COMPARISON:  Brain MRI 0049 hours today. Head CT yesterday. CTA head and neck 08/14/2021 and earlier. FINDINGS: CTA NECK Skeleton: Cervical spine degeneration with multilevel ankylosis. TMJ degeneration. No acute osseous abnormality identified. Upper chest: Chronic emphysema, moderate to severe in the upper lobes. No superior mediastinal lymphadenopathy. Other neck: No acute finding. Aortic arch: Severe Calcified aortic atherosclerosis. 3 vessel arch. Right carotid system: Extensive brachiocephalic artery, right CCA, right carotid bifurcation, proximal right ICA and distal bulb level atherosclerosis is stable from last year. Hemodynamically significant stenosis at the distal bulb level  numerically estimated at 60-65 % with respect to the distal vessel. Left carotid system: Less pronounced left CCA atherosclerosis and evidence of chronic left carotid endarterectomy with no hemodynamically significant stenosis. Vertebral arteries: Severe proximal right subclavian artery atherosclerosis results in high-grade string sign stenosis of the proximal subclavian artery redemonstrated on series 10, image 81. The vessel remains patent. Right vertebral artery origin is patent without significant stenosis despite calcified plaque. But there is asymmetric decreased enhancement of the right vertebral artery which remains patent to the skull base. Severe proximal left subclavian artery atherosclerosis also with high-grade stenosis  due to complex plaque on series 8, image 160, numerically estimated at 80 % with respect to the distal vessel and not significantly changed from last year. Left vertebral artery origin calcified plaque but only mild left vertebral origin stenosis. Codominant left vertebral artery is patent with superior enhancement compared to the right side. CTA HEAD Posterior circulation: Patent distal vertebral arteries and vertebrobasilar junction with calcified plaque. Only mild right V4 stenosis. Patent PICA origins. Patent basilar artery with mild irregularity, mild basilar stenosis (series 12, image 26. Patent SCA and PCA origins. Fetal type right posterior communicating artery origin. Left PCA demonstrates new tandem severe stenoses since last year on series 13, image 24, but there is maintained distal left PCA enhancement. No proximal occlusion. Fetal type right PCA is stable and patent with only mild irregularity. Anterior circulation: Both ICA siphons are patent with calcified plaque. On the left there is only mild stenosis. No significant stenosis on the right. Normal right posterior communicating artery origin. Patent carotid termini. Patent MCA and ACA origins with dominant right A1, left A1 is diminutive. Normal anterior communicating artery. Mild to moderate bilateral A2 segment stenoses, proximal right A2 and distal left A2 on series 13, image 21. This is stable. Left MCA M1 segment and bifurcation are patent without stenosis. Right MCA M1 segment and trifurcation are patent without stenosis. Bilateral MCA branches are stable with mild irregularity. Venous sinuses: Early contrast timing, not well evaluated. Anatomic variants: Fetal right PCA origin.  Dominant right ACA A1. Review of the MIP images confirms the above findings IMPRESSION: 1. Negative for emergent large vessel occlusion but positive for new Tandem Severe stenoses of the Left PCA since 2023 (P1 and P2 segment - series 13, image 24). AND Positive for  underlying chronic Very Severe bilateral proximal Subclavian atherosclerosis and stenosis: - RIGHT SUBCLAVIAN ARTERY ORIGIN RADIOGRAPHIC STRING SIGN. -80% proximal Left Subclavian Artery atherosclerosis. Associated asymmetrically decreased enhancement of the Right Vertebral Artery in the neck, remains patent. Only mild superimposed Vertebral Artery and Basilar artery plaque and stenosis. 2. Previous Left carotid endarterectomy with no significant left carotid stenosis. Right ICA origin and bulb bulky calcified plaque with up to 65% stenosis. 3. Moderate chronic stenoses of the bilateral ACA A2 segments, stable. 4. Aortic Atherosclerosis (ICD10-I70.0) and Emphysema (ICD10-J43.9). Electronically Signed   By: Odessa Fleming M.D.   On: 12/07/2022 06:32   MR BRAIN WO CONTRAST  Result Date: 12/07/2022 CLINICAL DATA:  Initial evaluation for acute vertigo, ataxia. EXAM: MRI HEAD WITHOUT CONTRAST TECHNIQUE: Multiplanar, multiecho pulse sequences of the brain and surrounding structures were obtained without intravenous contrast. COMPARISON:  CT from 12/06/2022. FINDINGS: Brain: Examination degraded by motion. Generalized age-related cerebral atrophy. Patchy and confluent T2/FLAIR hyperintensity involving the periventricular and deep white matter as well as the pons, consistent with chronic small vessel ischemic disease, moderately advanced. Small remote left cerebellar infarct noted. Multiple chronic lacunar infarcts noted about  the bilateral basal ganglia and thalami. Patchy small volume foci of restricted diffusion are seen involving the bilateral cerebral hemispheres, consistent with acute ischemic infarcts. The largest area of infarction measures approximately 8 mm at the left parietal lobe (series 2, image 46). Small volume involvement of the right frontal corona radiata (series 2, image 40). Small focus of acute ischemia noted within the pons as well (series 3, image 19). No associated hemorrhage or significant mass  effect. Gray-white matter differentiation otherwise maintained. No acute intracranial hemorrhage. Few scattered chronic micro hemorrhages noted, nonspecific. No mass lesion, midline shift or mass effect. Ventricular prominence related to global parenchymal volume loss without hydrocephalus. No extra-axial fluid collection. Pituitary gland suprasellar region within normal limits. Vascular: Major intracranial vascular flow voids are maintained. Skull and upper cervical spine: Craniocervical junction within normal limits. Bone marrow signal intensity normal. No scalp soft tissue abnormality. Sinuses/Orbits: Prior bilateral ocular lens replacement. Mild scattered mucosal thickening noted about the ethmoidal air cells and maxillary sinuses. Trace bilateral mastoid effusions, of doubtful significance. Visualized nasopharynx unremarkable. Other: None. IMPRESSION: 1. Patchy small volume acute ischemic infarcts involving the bilateral cerebral hemispheres and pons as above. No associated hemorrhage or significant mass effect. 2. Underlying age-related cerebral atrophy with moderately advanced chronic microvascular ischemic disease, with multiple chronic lacunar infarcts about the bilateral basal ganglia and thalami. Electronically Signed   By: Rise Mu M.D.   On: 12/07/2022 02:49   CT Head Wo Contrast  Result Date: 12/06/2022 CLINICAL DATA:  Weakness and dizziness. EXAM: CT HEAD WITHOUT CONTRAST TECHNIQUE: Contiguous axial images were obtained from the base of the skull through the vertex without intravenous contrast. RADIATION DOSE REDUCTION: This exam was performed according to the departmental dose-optimization program which includes automated exposure control, adjustment of the mA and/or kV according to patient size and/or use of iterative reconstruction technique. COMPARISON:  Head CT 08/14/2021 FINDINGS: Brain: Stable degree of atrophy and chronic small vessel ischemia. No acute hemorrhage, ischemia,  hydrocephalus, extra-axial collection or mass lesion/mass effect. Vascular: Atherosclerosis of skullbase vasculature without hyperdense vessel or abnormal calcification. Skull: No fracture or focal lesion. Sinuses/Orbits: Mild chronic mucosal thickening of the ethmoid air cells. Occasional opacification of lower right mastoid air cells, chronic. No acute findings. Other: None. IMPRESSION: 1. No acute intracranial abnormality. 2. Stable atrophy and chronic small vessel ischemia. Electronically Signed   By: Narda Rutherford M.D.   On: 12/06/2022 22:34   DG Chest Portable 1 View  Result Date: 12/06/2022 CLINICAL DATA:  Dyspnea EXAM: PORTABLE CHEST 1 VIEW COMPARISON:  08/02/2019 FINDINGS: Coarsening of the pulmonary interstitium is chronic in leads to underlying severe emphysema better seen on CT examination of 02/23/2021. No superimposed confluent pulmonary infiltrate. No pneumothorax or pleural effusion. Cardiac size within normal limits. No acute bone abnormality. IMPRESSION: 1. Severe emphysema. No superimposed acute cardiopulmonary process. Electronically Signed   By: Helyn Numbers M.D.   On: 12/06/2022 21:53    EKG:  2nd degree AV block type I, bifascicular block with LAFB, RBBB, no acute ischemic changes  ED Course:  Transferred [facility] to ED.  Treated with meclizine   Assessment/Plan:  89 y.o. male with hx carotid stenosis status post left CEA, hypertension, hyperlipidemia, COPD, ILD, who was [facility] ED [facility] for symptoms of vertigo and MRI evaluation to rule out stroke.  On interview reports that he has had approximately 1 month history of very brief, few minute long episodes of dizziness, room spinning.  Found to have multifocal acute ischemic stroke  Multifocal acute ischemic stroke in multiple vascular territories 1 month history of vertiginous type symptoms, also left lower extremity weakness.  CT head at outside hospital with no acute  findings.  MRI brain here demonstrating acute multifocal strokes including left parietal, right frontal corona radiata, pontine.  Also seen are chronic lacunar infarcts in bilateral basal ganglia and thalamus. etiology of stroke suspected cardioembolic but under investigation -Neurology consulted, appreciate recommendations.  Will continue to follow -CTA head and neck -Antiplatelet with aspirin 324 mg load then 81 mg daily for now -Check lipids and A1c -History statin intolerance is on rosuvastatin 5 mg weekly.  Add Zetia 10 mg daily -Telemetry, discharge with 30 day monitor if no arrhythmia detected here -TTE with bubble -PT/OT/SLP -DVT prophylaxis per below -Serial neurochecks -Permissive hypertension  Memory deficit No prior history of dementia -B12, thiamine, RPR, HIV ordered by neurology  History second-degree AV block type I Recently completed a cardiac monitor x 5 days demonstrating above.  No A-fib was seen -Hold his home metoprolol for now  Chronic medical problems: Hypertension: Hold his Losartan, metoprolol for permissive hypertension HLD: See stroke above BPH continue on finasteride.  Hold tamsulosin although likely will not affect blood pressure significantly, can resume tomorrow  Body mass index is 24.81 kg/m.    DVT prophylaxis:  Lovenox Code Status:  Full Code ; note I discussed with him due to his advanced age and comorbidities that likelihood of recovery back to baseline with cardiac/respiratory arrest is much lower compared to younger patients with less medical history.  Asked him to think more about CODE STATUS.  But for now remains full code  Diet:  Diet Orders (From admission, onward)     Start     Ordered   12/07/22 0458  Diet Heart Room service appropriate? Yes; Fluid consistency: Thin  Diet effective now       Question Answer Comment  Room service appropriate? Yes   Fluid consistency: Thin      12/07/22 0507           Family Communication:  No    Consults:  Neurology   Admission status:   Inpatient, Telemetry bed  Severity of Illness: The appropriate patient status for this patient is INPATIENT. Inpatient status is judged to be reasonable and necessary in order to provide the required intensity of service to ensure the patient's safety. The patient's presenting symptoms, physical exam findings, and initial radiographic and laboratory data in the context of their chronic comorbidities is felt to place them at high risk for further clinical deterioration. Furthermore, it is not anticipated that the patient will be medically stable for discharge from the hospital within 2 midnights of admission.   * I certify that at the point of admission it is my clinical judgment that the patient will require inpatient hospital care spanning beyond 2 midnights from the point of admission due to high intensity of service, high risk for further deterioration and high frequency of surveillance required.*   Dolly Rias, MD [facility]  How to contact the [facility] Attending or Consulting provider 7A - 7P or covering provider during after hours 7P -7A, for this patient.  Check the care team in [facility] and look for a) attending/consulting [facility] provider listed and b) the [facility] team listed Log into [email or web address] and use [facility] universal password to access. If you do not have the password, please contact the hospital operator. Locate the [facility] provider you  are looking for under [facility] and page to a number that you can be directly reached. If you still have difficulty reaching the provider, please page the [facility] (Director on Call) for the Hospitalists listed on amion for assistance.  12/07/2022, 7:02 AM

## 2022-12-07 NOTE — ED Notes (Signed)
Pt trying to get out of bed.  Re-oriented by staff and assisted back into bed.
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
## 2022-12-11 NOTE — Discharge Summary (Signed)
Physician Discharge Summary  Roger Benjamin WGN:[id number] DOB: 1931-11-13 DOA: 12/06/2022  PCP: Charlane Ferretti, DO  Admit date: 12/06/2022  Discharge date: 12/11/2022  Admitted From: Home.  Disposition: Left AMA.  Recommendations for Outpatient Follow-up:  Follow up with PCP in 1-2 weeks Please obtain BMP/CBC in one week Left AMA.  Home Health:None Equipment/Devices:None  Discharge Condition: Stable CODE STATUS:Full code Diet recommendation: Heart Healthy  Brief [facility] Course: This 89 yrs old Male with hx. of carotid stenosis,  status post left CEA, hypertension, hyperlipidemia, COPD, ILD, who presented in the ED for the symptoms of vertigo and MRI evaluation to rule out stroke. Patient reports approximately 1 month history of very brief, few minute long episodes of dizziness, with room spinning.  These are not positional, denies lightheadedness.  Additionally he reports that he has had weakness in his legs, worse in the left side.  Symptoms especially worse in the past 24 hours, unable to get up out of bed. denies any falls or head injury.  MRI showed multiple acute ischemic strokes in multiple vascular territories.  Patient was evaluated by neurology recommended stroke workup.  Patient started on aspirin,  Lipitor.  Patient was seen by neurology recommended aspirin and Plavix for 3 weeks and then Plavix only.  If atrial fibrillation is seen during workup will likely change to Plavix Pradaxa or Brilinta.  Patient left AGAINST MEDICAL ADVICE.  Patient was explained in detail about the risks of leaving AGAINST MEDICAL ADVICE include death.  He understood and signed AMA.  His daughter took him home.  Discharge Diagnoses:  Principal Problem:   Acute arterial ischemic stroke, multifocal, mult vascular territories [facility])    Discharge Instructions Patient left against medical Advice.  Allergies as of 12/07/2022       Reactions   Sulfa Antibiotics    Other reaction(s):  stomach upset   Tape    Needs paper tape        Medication List     ASK your doctor about these medications    aspirin EC 81 MG tablet Take 81 mg by mouth every other day. Swallow whole.   finasteride 5 MG tablet Commonly known as: PROSCAR Take 5 mg by mouth daily.   losartan 25 MG tablet Commonly known as: COZAAR Take 0.5 tablets (12.5 mg total) by mouth at bedtime.   metoprolol succinate 25 MG 24 hr tablet Commonly known as: TOPROL-XL Take 12.5 mg by mouth daily.   multivitamin with minerals tablet Take 1 tablet by mouth daily.   rosuvastatin 5 MG tablet Commonly known as: CRESTOR Take 5 mg by mouth once a week.   tamsulosin 0.4 MG Caps capsule Commonly known as: FLOMAX Take 0.4 mg by mouth daily.   VITAMIN B 12 PO Take 1 tablet by mouth daily.        Allergies  Allergen Reactions   Sulfa Antibiotics     Other reaction(s): stomach upset   Tape     Needs paper tape    Consultations: None   Procedures/Studies: ECHOCARDIOGRAM COMPLETE  Result Date: 12/07/2022    ECHOCARDIOGRAM REPORT   Patient Name:   Roger Benjamin Date of Exam: 12/07/2022 Medical Rec #:  [id number]        Height:       73.0 in Accession #:    [id number]       Weight:       188.1 lb Date of Birth:  01-04-1932        BSA:  2.096 m Patient Age:    89 years         BP:           149/64 mmHg Patient Gender: M                HR:           63 bpm. Exam Location:  Inpatient Procedure: 2D Echo, Color Doppler, Cardiac Doppler and Intracardiac            Opacification Agent Indications:    Stroke  History:        Patient has prior history of Echocardiogram examinations, most                 recent 10/16/2022. Stroke and Carotid Disease.  Sonographer:    Milbert Coulter Referring Phys: [id number] JONATHAN SEGARS IMPRESSIONS  1. Left ventricular ejection fraction, by estimation, is >75%. The left ventricle has hyperdynamic function. The left ventricle has no regional wall motion abnormalities. Left  ventricular diastolic parameters were normal.  2. Right ventricular systolic function is normal. The right ventricular size is normal.  3. The mitral valve is normal in structure. No evidence of mitral valve regurgitation. No evidence of mitral stenosis.  4. The aortic valve is tricuspid. Aortic valve regurgitation is not visualized. Aortic valve sclerosis is present, with no evidence of aortic valve stenosis.  5. The inferior vena cava is normal in size with greater than 50% respiratory variability, suggesting right atrial pressure of 3 mmHg. Comparison(s): A prior study was performed on 10/16/2022. LVEF is now hyperdynamic, Grade 1 diastolic dysfunction has resolved, otherwise no significant change. Conclusion(s)/Recommendation(s): No intracardiac source of embolism detected on this transthoracic study. Consider a transesophageal echocardiogram to exclude cardiac source of embolism if clinically indicated. No left ventricular mural or apical thrombus/thrombi. FINDINGS  Left Ventricle: Left ventricular ejection fraction, by estimation, is >75%. The left ventricle has hyperdynamic function. The left ventricle has no regional wall motion abnormalities. Definity contrast agent was given IV to delineate the left ventricular endocardial borders. The left ventricular internal cavity size was normal in size. There is no left ventricular hypertrophy. Left ventricular diastolic parameters were normal. Right Ventricle: The right ventricular size is normal. No increase in right ventricular wall thickness. Right ventricular systolic function is normal. Left Atrium: Left atrial size was normal in size. Right Atrium: Right atrial size was normal in size. Pericardium: There is no evidence of pericardial effusion. Mitral Valve: The mitral valve is normal in structure. No evidence of mitral valve regurgitation. No evidence of mitral valve stenosis. Tricuspid Valve: The tricuspid valve is not well visualized. Tricuspid valve  regurgitation is not demonstrated. No evidence of tricuspid stenosis. Aortic Valve: The aortic valve is tricuspid. Aortic valve regurgitation is not visualized. Aortic valve sclerosis is present, with no evidence of aortic valve stenosis. Aortic valve mean gradient measures 4.0 mmHg. Aortic valve peak gradient measures 6.9  mmHg. Aortic valve area, by VTI measures 2.63 cm. Pulmonic Valve: The pulmonic valve was normal in structure. Pulmonic valve regurgitation is trivial. No evidence of pulmonic stenosis. Aorta: The aortic root and ascending aorta are structurally normal, with no evidence of dilitation. Venous: The inferior vena cava is normal in size with greater than 50% respiratory variability, suggesting right atrial pressure of 3 mmHg. IAS/Shunts: The atrial septum is grossly normal.  LEFT VENTRICLE PLAX 2D LVIDd:         4.20 cm   Diastology LVIDs:  2.20 cm   LV e' medial:    7.07 cm/s LV PW:         1.00 cm   LV E/e' medial:  11.9 LV IVS:        1.10 cm   LV e' lateral:   8.92 cm/s LVOT diam:     2.00 cm   LV E/e' lateral: 9.4 LV SV:         80 LV SV Index:   38 LVOT Area:     3.14 cm  RIGHT VENTRICLE RV Basal diam:  3.50 cm RV Mid diam:    2.70 cm RV S prime:     13.50 cm/s TAPSE (M-mode): 3.0 cm LEFT ATRIUM             Index        RIGHT ATRIUM           Index LA diam:        2.50 cm 1.19 cm/m   RA Area:     17.20 cm LA Vol (A2C):   67.5 ml 32.20 ml/m  RA Volume:   44.00 ml  20.99 ml/m LA Vol (A4C):   38.9 ml 18.56 ml/m LA Biplane Vol: 52.2 ml 24.90 ml/m  AORTIC VALVE AV Area (Vmax):    2.54 cm AV Area (Vmean):   2.48 cm AV Area (VTI):     2.63 cm AV Vmax:           131.00 cm/s AV Vmean:          89.800 cm/s AV VTI:            0.305 m AV Peak Grad:      6.9 mmHg AV Mean Grad:      4.0 mmHg LVOT Vmax:         106.00 cm/s LVOT Vmean:        71.000 cm/s LVOT VTI:          0.255 m LVOT/AV VTI ratio: 0.84  AORTA Ao Root diam: 3.60 cm Ao Asc diam:  3.70 cm MITRAL VALVE MV Area (PHT): 2.52 cm      SHUNTS MV Decel Time: 301 msec     Systemic VTI:  0.26 m MV E velocity: 83.90 cm/s   Systemic Diam: 2.00 cm MV A velocity: 144.00 cm/s MV E/A ratio:  0.58 Sunit Tolia Electronically signed by Tessa Lerner Signature Date/Time: 12/07/2022/6:32:10 PM    Final    CT ANGIO HEAD NECK W WO CM  Result Date: 12/07/2022 CLINICAL DATA:  89 year old male with weakness, dizziness, vertigo, ataxia, patchy acute infarcts in both parietal lobes, right corona radiata. EXAM: CT ANGIOGRAPHY HEAD AND NECK WITH AND WITHOUT CONTRAST TECHNIQUE: Multidetector CT imaging of the head and neck was performed using the standard protocol during bolus administration of intravenous contrast. Multiplanar CT image reconstructions and MIPs were obtained to evaluate the vascular anatomy. Carotid stenosis measurements (when applicable) are obtained utilizing NASCET criteria, using the distal internal carotid diameter as the denominator. RADIATION DOSE REDUCTION: This exam was performed according to the departmental dose-optimization program which includes automated exposure control, adjustment of the mA and/or kV according to patient size and/or use of iterative reconstruction technique. CONTRAST:  75mL OMNIPAQUE IOHEXOL 350 MG/ML SOLN COMPARISON:  Brain MRI 0049 hours today. Head CT yesterday. CTA head and neck 08/14/2021 and earlier. FINDINGS: CTA NECK Skeleton: Cervical spine degeneration with multilevel ankylosis. TMJ degeneration. No acute osseous abnormality identified. Upper chest: Chronic emphysema, moderate to severe in the  upper lobes. No superior mediastinal lymphadenopathy. Other neck: No acute finding. Aortic arch: Severe Calcified aortic atherosclerosis. 3 vessel arch. Right carotid system: Extensive brachiocephalic artery, right CCA, right carotid bifurcation, proximal right ICA and distal bulb level atherosclerosis is stable from last year. Hemodynamically significant stenosis at the distal bulb level numerically estimated at 60-65  % with respect to the distal vessel. Left carotid system: Less pronounced left CCA atherosclerosis and evidence of chronic left carotid endarterectomy with no hemodynamically significant stenosis. Vertebral arteries: Severe proximal right subclavian artery atherosclerosis results in high-grade string sign stenosis of the proximal subclavian artery redemonstrated on series 10, image 81. The vessel remains patent. Right vertebral artery origin is patent without significant stenosis despite calcified plaque. But there is asymmetric decreased enhancement of the right vertebral artery which remains patent to the skull base. Severe proximal left subclavian artery atherosclerosis also with high-grade stenosis due to complex plaque on series 8, image 160, numerically estimated at 80 % with respect to the distal vessel and not significantly changed from last year. Left vertebral artery origin calcified plaque but only mild left vertebral origin stenosis. Codominant left vertebral artery is patent with superior enhancement compared to the right side. CTA HEAD Posterior circulation: Patent distal vertebral arteries and vertebrobasilar junction with calcified plaque. Only mild right V4 stenosis. Patent PICA origins. Patent basilar artery with mild irregularity, mild basilar stenosis (series 12, image 26. Patent SCA and PCA origins. Fetal type right posterior communicating artery origin. Left PCA demonstrates new tandem severe stenoses since last year on series 13, image 24, but there is maintained distal left PCA enhancement. No proximal occlusion. Fetal type right PCA is stable and patent with only mild irregularity. Anterior circulation: Both ICA siphons are patent with calcified plaque. On the left there is only mild stenosis. No significant stenosis on the right. Normal right posterior communicating artery origin. Patent carotid termini. Patent MCA and ACA origins with dominant right A1, left A1 is diminutive. Normal  anterior communicating artery. Mild to moderate bilateral A2 segment stenoses, proximal right A2 and distal left A2 on series 13, image 21. This is stable. Left MCA M1 segment and bifurcation are patent without stenosis. Right MCA M1 segment and trifurcation are patent without stenosis. Bilateral MCA branches are stable with mild irregularity. Venous sinuses: Early contrast timing, not well evaluated. Anatomic variants: Fetal right PCA origin.  Dominant right ACA A1. Review of the MIP images confirms the above findings IMPRESSION: 1. Negative for emergent large vessel occlusion but positive for new Tandem Severe stenoses of the Left PCA since 2023 (P1 and P2 segment - series 13, image 24). AND Positive for underlying chronic Very Severe bilateral proximal Subclavian atherosclerosis and stenosis: - RIGHT SUBCLAVIAN ARTERY ORIGIN RADIOGRAPHIC STRING SIGN. -80% proximal Left Subclavian Artery atherosclerosis. Associated asymmetrically decreased enhancement of the Right Vertebral Artery in the neck, remains patent. Only mild superimposed Vertebral Artery and Basilar artery plaque and stenosis. 2. Previous Left carotid endarterectomy with no significant left carotid stenosis. Right ICA origin and bulb bulky calcified plaque with up to 65% stenosis. 3. Moderate chronic stenoses of the bilateral ACA A2 segments, stable. 4. Aortic Atherosclerosis (ICD10-I70.0) and Emphysema (ICD10-J43.9). Electronically Signed   By: Odessa Fleming M.D.   On: 12/07/2022 06:32   MR BRAIN WO CONTRAST  Result Date: 12/07/2022 CLINICAL DATA:  Initial evaluation for acute vertigo, ataxia. EXAM: MRI HEAD WITHOUT CONTRAST TECHNIQUE: Multiplanar, multiecho pulse sequences of the brain and surrounding structures were obtained without intravenous contrast. COMPARISON:  CT from 12/06/2022. FINDINGS: Brain: Examination degraded by motion. Generalized age-related cerebral atrophy. Patchy and confluent T2/FLAIR hyperintensity involving the periventricular  and deep white matter as well as the pons, consistent with chronic small vessel ischemic disease, moderately advanced. Small remote left cerebellar infarct noted. Multiple chronic lacunar infarcts noted about the bilateral basal ganglia and thalami. Patchy small volume foci of restricted diffusion are seen involving the bilateral cerebral hemispheres, consistent with acute ischemic infarcts. The largest area of infarction measures approximately 8 mm at the left parietal lobe (series 2, image 46). Small volume involvement of the right frontal corona radiata (series 2, image 40). Small focus of acute ischemia noted within the pons as well (series 3, image 19). No associated hemorrhage or significant mass effect. Gray-white matter differentiation otherwise maintained. No acute intracranial hemorrhage. Few scattered chronic micro hemorrhages noted, nonspecific. No mass lesion, midline shift or mass effect. Ventricular prominence related to global parenchymal volume loss without hydrocephalus. No extra-axial fluid collection. Pituitary gland suprasellar region within normal limits. Vascular: Major intracranial vascular flow voids are maintained. Skull and upper cervical spine: Craniocervical junction within normal limits. Bone marrow signal intensity normal. No scalp soft tissue abnormality. Sinuses/Orbits: Prior bilateral ocular lens replacement. Mild scattered mucosal thickening noted about the ethmoidal air cells and maxillary sinuses. Trace bilateral mastoid effusions, of doubtful significance. Visualized nasopharynx unremarkable. Other: None. IMPRESSION: 1. Patchy small volume acute ischemic infarcts involving the bilateral cerebral hemispheres and pons as above. No associated hemorrhage or significant mass effect. 2. Underlying age-related cerebral atrophy with moderately advanced chronic microvascular ischemic disease, with multiple chronic lacunar infarcts about the bilateral basal ganglia and thalami.  Electronically Signed   By: Rise Mu M.D.   On: 12/07/2022 02:49   CT Head Wo Contrast  Result Date: 12/06/2022 CLINICAL DATA:  Weakness and dizziness. EXAM: CT HEAD WITHOUT CONTRAST TECHNIQUE: Contiguous axial images were obtained from the base of the skull through the vertex without intravenous contrast. RADIATION DOSE REDUCTION: This exam was performed according to the departmental dose-optimization program which includes automated exposure control, adjustment of the mA and/or kV according to patient size and/or use of iterative reconstruction technique. COMPARISON:  Head CT 08/14/2021 FINDINGS: Brain: Stable degree of atrophy and chronic small vessel ischemia. No acute hemorrhage, ischemia, hydrocephalus, extra-axial collection or mass lesion/mass effect. Vascular: Atherosclerosis of skullbase vasculature without hyperdense vessel or abnormal calcification. Skull: No fracture or focal lesion. Sinuses/Orbits: Mild chronic mucosal thickening of the ethmoid air cells. Occasional opacification of lower right mastoid air cells, chronic. No acute findings. Other: None. IMPRESSION: 1. No acute intracranial abnormality. 2. Stable atrophy and chronic small vessel ischemia. Electronically Signed   By: Narda Rutherford M.D.   On: 12/06/2022 22:34   DG Chest Portable 1 View  Result Date: 12/06/2022 CLINICAL DATA:  Dyspnea EXAM: PORTABLE CHEST 1 VIEW COMPARISON:  08/02/2019 FINDINGS: Coarsening of the pulmonary interstitium is chronic in leads to underlying severe emphysema better seen on CT examination of 02/23/2021. No superimposed confluent pulmonary infiltrate. No pneumothorax or pleural effusion. Cardiac size within normal limits. No acute bone abnormality. IMPRESSION: 1. Severe emphysema. No superimposed acute cardiopulmonary process. Electronically Signed   By: Helyn Numbers M.D.   On: 12/06/2022 21:53     Subjective: Patient left against medical Advice.  Discharge Exam: Vitals:    12/07/22 1500 12/07/22 1724  BP: 138/82 131/68  Pulse: 68 67  Resp: 14 18  Temp:  (!) 97.4 F (36.3 C)  SpO2: 96% 97%   Vitals:  12/07/22 1115 12/07/22 1450 12/07/22 1500 12/07/22 1724  BP: (!) 150/73 121/69 138/82 131/68  Pulse: 73 62 68 67  Resp: 18  14 18   Temp:    (!) 97.4 F (36.3 C)  TempSrc:    Oral  SpO2: 95% 96% 96% 97%  Weight:        General: Pt is alert, awake, not in acute distress Cardiovascular: RRR, S1/S2 +, no rubs, no gallops Respiratory: CTA bilaterally, no wheezing, no rhonchi Abdominal: Soft, NT, ND, bowel sounds + Extremities: no edema, no cyanosis    The results of significant diagnostics from this hospitalization (including imaging, microbiology, ancillary and laboratory) are listed below for reference.     Microbiology: No results found for this or any previous visit (from the past 240 hour(s)).   Labs: BNP (last 3 results) No results for input(s): "BNP" in the last 8760 hours. Basic Metabolic Panel: Recent Labs  Lab 12/06/22 2030 12/07/22 0753  NA 132* 136  K 4.2 4.1  CL 99 98  CO2 26 24  GLUCOSE 120* 90  BUN 23 13  CREATININE 0.90 0.68  CALCIUM 9.4 9.2  MG  --  2.2  PHOS  --  3.8   Liver Function Tests: No results for input(s): "AST", "ALT", "ALKPHOS", "BILITOT", "PROT", "ALBUMIN" in the last 168 hours. No results for input(s): "LIPASE", "AMYLASE" in the last 168 hours. No results for input(s): "AMMONIA" in the last 168 hours. CBC: Recent Labs  Lab 12/06/22 2030 12/07/22 0753  WBC 13.9* 13.4*  HGB 15.4 14.4  HCT 44.6 43.0  MCV 92.0 92.5  PLT 255 255   Cardiac Enzymes: No results for input(s): "CKTOTAL", "CKMB", "CKMBINDEX", "TROPONINI" in the last 168 hours. BNP: Invalid input(s): "POCBNP" CBG: No results for input(s): "GLUCAP" in the last 168 hours. D-Dimer No results for input(s): "DDIMER" in the last 72 hours. Hgb A1c No results for input(s): "HGBA1C" in the last 72 hours. Lipid Profile No results for  input(s): "CHOL", "HDL", "LDLCALC", "TRIG", "CHOLHDL", "LDLDIRECT" in the last 72 hours. Thyroid function studies No results for input(s): "TSH", "T4TOTAL", "T3FREE", "THYROIDAB" in the last 72 hours.  Invalid input(s): "FREET3" Anemia work up No results for input(s): "VITAMINB12", "FOLATE", "FERRITIN", "TIBC", "IRON", "RETICCTPCT" in the last 72 hours. Urinalysis    Component Value Date/Time   COLORURINE YELLOW 09/23/2019 1049   APPEARANCEUR CLEAR 09/23/2019 1049   LABSPEC 1.018 09/23/2019 1049   PHURINE 6.0 09/23/2019 1049   GLUCOSEU NEGATIVE 09/23/2019 1049   HGBUR NEGATIVE 09/23/2019 1049   BILIRUBINUR NEGATIVE 09/23/2019 1049   KETONESUR NEGATIVE 09/23/2019 1049   PROTEINUR NEGATIVE 09/23/2019 1049   NITRITE NEGATIVE 09/23/2019 1049   LEUKOCYTESUR NEGATIVE 09/23/2019 1049   Sepsis Labs Recent Labs  Lab 12/06/22 2030 12/07/22 0753  WBC 13.9* 13.4*   Microbiology No results found for this or any previous visit (from the past 240 hour(s)).   Time coordinating discharge: Over 30 minutes  SIGNED:   Willeen Niece, MD  [facility] 12/11/2022, 4:23 PM Pager   If 7PM-7AM, please contact night-coverage

## 2022-12-12 ENCOUNTER — Encounter: Payer: Self-pay | Admitting: *Deleted

## 2022-12-13 ENCOUNTER — Ambulatory Visit: Payer: Medicare Other | Admitting: Neurology

## 2022-12-13 ENCOUNTER — Encounter: Payer: Self-pay | Admitting: Neurology

## 2022-12-13 VITALS — BP 105/57 | HR 69 | Ht 73.0 in | Wt 185.0 lb

## 2022-12-13 DIAGNOSIS — Z9289 Personal history of other medical treatment: Secondary | ICD-10-CM | POA: Diagnosis not present

## 2022-12-13 DIAGNOSIS — Z8673 Personal history of transient ischemic attack (TIA), and cerebral infarction without residual deficits: Secondary | ICD-10-CM

## 2022-12-13 NOTE — Patient Instructions (Addendum)
You had recent strokes, your strokes affected both sides of your brain. Here are my recommendations for you from our discussion today:  I recommend that you continue with baby aspirin and Plavix daily for a total of 3 weeks after your stroke and then you are supposed to take Plavix daily.   Please talk to your primary care about getting ongoing checkup of your blood pressure and cholesterol and continue with Plavix through your PCP.   I would recommend close follow-up with cardiology and pulmonology as well. I recommend that you talk to your cardiologist about the possibility of an implantable heart monitor called loop monitor to look for underlying irregular heartbeat.  I do not recommend that you drive any longer. I recommend that you use your walker at all times.  Start therapy as planned through [facility].  Do not drink any alcohol any longer, it will affect your balance adversely.
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
## 2022-12-14 ENCOUNTER — Ambulatory Visit: Payer: Medicare Other | Admitting: Internal Medicine

## 2022-12-26 NOTE — Progress Notes (Unsigned)
Cardiology Office Note:  .   Date:  12/27/2022  ID:  Roger Benjamin, DOB 10-29-1931, MRN [id number] PCP: Charlane Ferretti, DO  [facility] Providers Cardiologist:  Orbie Pyo, MD {  History of Present Illness: .   Roger Benjamin is a 89 y.o. male with a past medical history of weakness, aortic atherosclerosis, CAD seen on CAT scan, history of left-sided carotid endarterectomy, HLD, HTN, trifascicular block, and palpitations here for follow-up appointment.  Recently saw Dr. Lynnette Caffey July of this year.  That time labs were ordered as well as an echocardiogram and a ZIO monitor for the workup of weakness.  ZIO monitor did not show any atrial fibrillation but did show second-degree AV block and he was referred to EP for further evaluation.  Echocardiogram showed hyperdynamic ventricular function grade 1 DD and otherwise no significant changes from last echocardiogram.  Unfortunately, he was in the ED 10/11 for dizziness and left AMA.  He was unable to get up out of bed and symptoms were worse the past 24 hours.  He denied of fall or head injury.  MRI showed multiple acute ischemic strokes in multiple vascular territories.  Evaluated by neuro which recommended a stroke workup.  Recommended aspirin and Plavix x 3 weeks then Plavix only.  Today, he has a  history of coronary disease, carotid disease, and prostate issues, recently experienced a stroke. The patient reported feeling dizzy at the time of the stroke, but did not specify any other symptoms. An MRI revealed multiple acute ischemic strokes in different territories of the brain. The patient is nearing the end of the three-week period for DAPT and will resume plavix alone.  The patient also has high-grade stenosis in the subclavian artery on both sides, which may be discussed in a later appointment with a vascular specialist. The patient has noticed some swelling in his ankles, particularly on the left side, but does not take any  fluid medicine. The swelling tends to resolve overnight. The patient also experiences increased urinary frequency due to prostate issues.  The patient's LDL cholesterol level is slightly elevated at 97, which is a concern given his vascular history. The patient is currently taking rosuvastatin once a week, but I suggests increasing the frequency to three times a week to help lower the LDL level.  Reports no shortness of breath nor dyspnea on exertion. Reports no chest pain, pressure, or tightness. No edema, orthopnea, PND. Reports no palpitations.    ROS: pertinent ROS in HPI   Studies Reviewed: .       Echocardiogram 12/07/22 IMPRESSIONS     1. Left ventricular ejection fraction, by estimation, is >75%. The left  ventricle has hyperdynamic function. The left ventricle has no regional  wall motion abnormalities. Left ventricular diastolic parameters were  normal.   2. Right ventricular systolic function is normal. The right ventricular  size is normal.   3. The mitral valve is normal in structure. No evidence of mitral valve  regurgitation. No evidence of mitral stenosis.   4. The aortic valve is tricuspid. Aortic valve regurgitation is not  visualized. Aortic valve sclerosis is present, with no evidence of aortic  valve stenosis.   5. The inferior vena cava is normal in size with greater than 50%  respiratory variability, suggesting right atrial pressure of 3 mmHg.   Comparison(s): A prior study was performed on 10/16/2022. LVEF is now  hyperdynamic, Grade 1 diastolic dysfunction has resolved, otherwise no  significant change.  Conclusion(s)/Recommendation(s): No intracardiac source of embolism  detected on this transthoracic study. Consider a transesophageal  echocardiogram to exclude cardiac source of embolism if clinically  indicated. No left ventricular mural or apical  thrombus/thrombi.   FINDINGS   Left Ventricle: Left ventricular ejection fraction, by estimation, is   >75%. The left ventricle has hyperdynamic function. The left ventricle has  no regional wall motion abnormalities. Definity contrast agent was given  IV to delineate the left  ventricular endocardial borders. The left ventricular internal cavity size  was normal in size. There is no left ventricular hypertrophy. Left  ventricular diastolic parameters were normal.   Right Ventricle: The right ventricular size is normal. No increase in  right ventricular wall thickness. Right ventricular systolic function is  normal.   Left Atrium: Left atrial size was normal in size.   Right Atrium: Right atrial size was normal in size.   Pericardium: There is no evidence of pericardial effusion.   Mitral Valve: The mitral valve is normal in structure. No evidence of  mitral valve regurgitation. No evidence of mitral valve stenosis.   Tricuspid Valve: The tricuspid valve is not well visualized. Tricuspid  valve regurgitation is not demonstrated. No evidence of tricuspid  stenosis.   Aortic Valve: The aortic valve is tricuspid. Aortic valve regurgitation is  not visualized. Aortic valve sclerosis is present, with no evidence of  aortic valve stenosis. Aortic valve mean gradient measures 4.0 mmHg.  Aortic valve peak gradient measures 6.9   mmHg. Aortic valve area, by VTI measures 2.63 cm.   Pulmonic Valve: The pulmonic valve was normal in structure. Pulmonic valve  regurgitation is trivial. No evidence of pulmonic stenosis.   Aorta: The aortic root and ascending aorta are structurally normal, with  no evidence of dilitation.   Venous: The inferior vena cava is normal in size with greater than 50%  respiratory variability, suggesting right atrial pressure of 3 mmHg.   IAS/Shunts: The atrial septum is grossly normal.           Physical Exam:   VS:  BP 104/74   Pulse 79   Ht 6\' 1"  (1.854 m)   Wt 186 lb 12.8 oz (84.7 kg)   SpO2 95%   BMI 24.65 kg/m    Wt Readings from Last 3 Encounters:   12/27/22 186 lb 12.8 oz (84.7 kg)  12/13/22 185 lb (83.9 kg)  12/07/22 188 lb 0.8 oz (85.3 kg)    GEN: Well nourished, well developed in no acute distress NECK: No JVD; No carotid bruits CARDIAC: RRR with some skipped beats, no murmurs, rubs, gallops RESPIRATORY:  Clear to auscultation without rales, wheezing or rhonchi  ABDOMEN: Soft, non-tender, non-distended EXTREMITIES:  + bilateral pitting edema 1+; No deformity   ASSESSMENT AND PLAN: .     Cerebrovascular Accident (CVA) Recent stroke with dizziness as the main symptom. MRI showed multiple acute ischemic strokes in different territories. Currently on dual antiplatelet therapy with Aspirin and Clopidogrel (Plavix). -Discontinue Aspirin after 12/27/2022, continue Clopidogrel (Plavix) monotherapy.  Hyperlipidemia LDL 97, goal <70 due to history of coronary and carotid disease. -Increase Rosuvastatin to three times a week (Monday, Wednesday, Friday). -Recheck lipid panel in January 2025.  Peripheral Edema Noted bilateral lower extremity edema, more on the left. No diuretic therapy currently. -Advise elevation of feet and consider compression stockings. -No addition of diuretic therapy at this time due to potential exacerbation of urinary frequency from prostate issues.  Follow-up with general cardiologist No current general cardiologist  identified. -Schedule follow-up with Dr. Lynnette Caffey as general cardiologist.  CAD  No chest pain Continue current medication regimen      Dispo: Please follow-up in 3 months with Dr. Lynnette Caffey  Signed, Sharlene Dory, PA-C

## 2022-12-27 ENCOUNTER — Ambulatory Visit (INDEPENDENT_AMBULATORY_CARE_PROVIDER_SITE_OTHER): Payer: Medicare Other | Admitting: Vascular Surgery

## 2022-12-27 ENCOUNTER — Encounter: Payer: Self-pay | Admitting: Vascular Surgery

## 2022-12-27 ENCOUNTER — Encounter: Payer: Self-pay | Admitting: Physician Assistant

## 2022-12-27 ENCOUNTER — Ambulatory Visit: Payer: Medicare Other | Attending: Physician Assistant | Admitting: Physician Assistant

## 2022-12-27 VITALS — BP 121/78 | HR 73 | Temp 98.6°F | Resp 20 | Ht 73.0 in | Wt 185.0 lb

## 2022-12-27 VITALS — BP 104/74 | HR 79 | Ht 73.0 in | Wt 186.8 lb

## 2022-12-27 DIAGNOSIS — I441 Atrioventricular block, second degree: Secondary | ICD-10-CM | POA: Diagnosis present

## 2022-12-27 DIAGNOSIS — I2510 Atherosclerotic heart disease of native coronary artery without angina pectoris: Secondary | ICD-10-CM | POA: Diagnosis present

## 2022-12-27 DIAGNOSIS — I6529 Occlusion and stenosis of unspecified carotid artery: Secondary | ICD-10-CM

## 2022-12-27 DIAGNOSIS — I639 Cerebral infarction, unspecified: Secondary | ICD-10-CM | POA: Diagnosis present

## 2022-12-27 DIAGNOSIS — I771 Stricture of artery: Secondary | ICD-10-CM | POA: Diagnosis not present

## 2022-12-27 DIAGNOSIS — I10 Essential (primary) hypertension: Secondary | ICD-10-CM

## 2022-12-27 DIAGNOSIS — E785 Hyperlipidemia, unspecified: Secondary | ICD-10-CM | POA: Diagnosis present

## 2022-12-27 DIAGNOSIS — R531 Weakness: Secondary | ICD-10-CM | POA: Diagnosis present

## 2022-12-27 NOTE — Progress Notes (Signed)
HISTORY AND PHYSICAL     CC:  follow up. Requesting Provider:  Charlane Ferretti, DO  HPI: This is a 89 y.o. male here for follow up for carotid artery stenosis.  Pt is s/p left CEA for asymptomatic carotid artery stenosis on 09/23/2019 by Dr. Arbie Cookey.    Pt was last seen 10/12/2021 and at that time he had some dizziness and had a CTA that revealed right SCA stenosis and right CCA and ICA with less than 50% stenosis.  The left ICA did not have any recurrent stenosis.  His dizziness had improved with hydration and he was not having any other neurological sx.    Pt returns today for follow up.    Pt denies any amaurosis fugax, speech difficulties, weakness, numbness, paralysis or clumsiness or facial droop.    He states that he had covid twice in 2022, once early in the year and again in September.  He states since then, he has had weakness in both legs with walking.  He denies any claudication, rest pain or non healing wounds.   The pt is on a statin for cholesterol management.  The pt is on a daily aspirin.   Other AC:  none The pt is on BB for hypertension.   The pt does not have diabetes Tobacco hx:  former   Past Medical History:  Diagnosis Date   Atrioventricular block    1st degree   Carotid artery stenosis    Colon polyposis    Emphysema lung (HCC)    incidental finding on CT scan 01/2021   HLD (hyperlipidemia)    Hypertension    Unsure is on Metoprolol   Psoriasis    Skin cancer     Past Surgical History:  Procedure Laterality Date   ENDARTERECTOMY Left 09/23/2019   Procedure: ENDARTERECTOMY LEFT CAROTID with PATCH ANGIOPLASTY;  Surgeon: Larina Earthly, MD;  Location: [facility] OR;  Service: Vascular;  Laterality: Left;   FRACTURE SURGERY Left    leg   SKIN CANCER EXCISION     TONSILLECTOMY     as a child    Allergies  Allergen Reactions   Sulfa Antibiotics     Other reaction(s): stomach upset   Tape     Needs paper tape    Current Outpatient Medications   Medication Sig Dispense Refill   aspirin EC 81 MG tablet Take 81 mg by mouth every other day. Swallow whole.     b complex vitamins capsule Take 1 capsule by mouth daily.     Cholecalciferol (VITAMIN D-3 PO) Take 1 capsule by mouth daily at 6 (six) AM.     clopidogrel (PLAVIX) 75 MG tablet Take 75 mg by mouth daily. Pt takes 1/2 tablet at night.     Cyanocobalamin (VITAMIN B 12 PO) Take 1 tablet by mouth daily.     finasteride (PROSCAR) 5 MG tablet Take 5 mg by mouth daily.     losartan (COZAAR) 25 MG tablet Take 0.5 tablets (12.5 mg total) by mouth at bedtime. 45 tablet 3   Multiple Vitamins-Minerals (MULTIVITAMIN WITH MINERALS) tablet Take 1 tablet by mouth daily.     rosuvastatin (CRESTOR) 5 MG tablet Take 5 mg by mouth once a week.     tamsulosin (FLOMAX) 0.4 MG CAPS capsule Take 0.4 mg by mouth daily.     triamcinolone (KENALOG) 0.025 % cream Apply 1 Application topically as needed.     No current facility-administered medications for this visit.    Family History  Problem Relation Age of Onset   Stroke Neg Hx     Social History   Socioeconomic History   Marital status: Married    Spouse name: Not on file   Number of children: Not on file   Years of education: Not on file   Highest education level: Not on file  Occupational History   Not on file  Tobacco Use   Smoking status: Former    Current packs/day: 0.00    Average packs/day: 3.0 packs/day for 30.0 years (90.0 ttl pk-yrs)    Types: Cigarettes    Start date: 22    Quit date: 02/27/1980    Years since quitting: 42.8   Smokeless tobacco: Never  Vaping Use   Vaping status: Never Used  Substance and Sexual Activity   Alcohol use: Yes    Alcohol/week: 7.0 standard drinks of alcohol    Types: 7 Shots of liquor per week    Comment: 1 drink daily    Drug use: Never   Sexual activity: Not on file  Other Topics Concern   Not on file  Social History Narrative   Not on file   Social Determinants of Health    Financial Resource Strain: Not on file  Food Insecurity: No Food Insecurity (12/07/2022)   Hunger Vital Sign    Worried About Running Out of Food in the Last Year: Never true    Ran Out of Food in the Last Year: Never true  Transportation Needs: No Transportation Needs (12/07/2022)   PRAPARE - Administrator, Civil Service (Medical): No    Lack of Transportation (Non-Medical): No  Physical Activity: Not on file  Stress: Not on file  Social Connections: Not on file  Intimate Partner Violence: Not At Risk (12/07/2022)   Humiliation, Afraid, Rape, and Kick questionnaire    Fear of Current or Ex-Partner: No    Emotionally Abused: No    Physically Abused: No    Sexually Abused: No     REVIEW OF SYSTEMS:   [X]  denotes positive finding, [ ]  denotes negative finding Cardiac  Comments:  Chest pain or chest pressure:    Shortness of breath upon exertion:    Short of breath when lying flat:    Irregular heart rhythm:        Vascular    Pain in calf, thigh, or hip brought on by ambulation:    Pain in feet at night that wakes you up from your sleep:     Blood clot in your veins:    Leg swelling:         Pulmonary    Oxygen at home:    Productive cough:     Wheezing:         Neurologic    Sudden weakness in arms or legs:     Sudden numbness in arms or legs:     Sudden onset of difficulty speaking or slurred speech:    Temporary loss of vision in one eye:     Problems with dizziness:         Gastrointestinal    Blood in stool:     Vomited blood:         Genitourinary    Burning when urinating:     Blood in urine:        Psychiatric    Major depression:         Hematologic    Bleeding problems:    Problems with blood clotting too  easily:        Skin    Rashes or ulcers:        Constitutional    Fever or chills:      PHYSICAL EXAMINATION:  There were no vitals filed for this visit.  There is no height or weight on file to calculate  BMI.   General:  WDWN in NAD; vital signs documented above Gait: Not observed HENT: WNL, normocephalic Pulmonary: normal non-labored breathing Cardiac: regular HR, with carotid bruit on the left Abdomen: soft, NT; aortic pulse is not palpable Skin: without rashes Vascular Exam/Pulses: Palpable bilateral radial and AT pulses Extremities: without open wounds Musculoskeletal: no muscle wasting or atrophy  Neurologic: A&O X 3; moving all extremities equally; speech is fluent/normal Psychiatric:  The pt has Normal affect.   Non-Invasive Vascular Imaging:   Carotid Duplex on 05/24/2022 Right:  1-39% ICA stenosis Left:  1-39% ICA stenosis      ASSESSMENT/PLAN:: 89 y.o. male here for follow up carotid artery stenosis and hx of left CEA for asymptomatic carotid artery stenosis on 09/23/2019 by Dr. Arbie Cookey.    Since last visit, Kailon had a stroke with imaging demonstrating bilateral parietal infarcts.  Imaging also demonstrated bilateral severe subclavian artery stenoses.  Left ICA 60% stenosis, right ICA widely patent  I had a long conversation with Keagan regarding the above.  I do not think that his recent stroke can be attributed to right sided ICA stenosis.  If this lesion was the culprit, the stroke distribution should not be bilateral. Regarding bilateral subclavian artery stenosis, Jose is asymptomatic.  He has palpable pulses in bilateral upper extremities.  No vertebrobasilar symptoms.  No effort-induced presyncopal or syncopal episodes.  No plan for intervention.  I plan to define the right-sided ICA stenosis as asymptomatic at this time.  Will continue to follow this on a every 6 month basis.  Will reach out to Dr. Frances Furbish for her opinion to ensure all specialists agree.  My plan is to call Smayan next week after I have discussed his case with Dr. Frances Furbish.  Carla would benefit from increasing his Crestor to 20 mg daily-high intensity.  We discussed that this will help  prevent further atherosclerotic buildup.   [facility] [phone]

## 2022-12-27 NOTE — Patient Instructions (Addendum)
Medication Instructions:  OK to STOP Aspirin this Saturday CONTINUE Plavix  *If you need a refill on your cardiac medications before your next appointment, please call your pharmacy*   Lab Work: None Ordered   Testing/Procedures: None Ordered   Follow-Up: At [facility], you and your health needs are our priority.  As part of our continuing mission to provide you with exceptional heart care, we have created designated Provider Care Teams.  These Care Teams include your primary Cardiologist (physician) and Advanced Practice Providers (APPs -  Physician Assistants and Nurse Practitioners) who all work together to provide you with the care you need, when you need it.  We recommend signing up for the patient portal called "MyChart".  Sign up information is provided on this After Visit Summary.  MyChart is used to connect with patients for Virtual Visits (Telemedicine).  Patients are able to view lab/test results, encounter notes, upcoming appointments, etc.  Non-urgent messages can be sent to your provider as well.   To learn more about what you can do with MyChart, go to [email or web address].    Your next appointment:   3 month(s)  Provider:   Orbie Pyo, MD     Other Instructions

## 2023-01-03 ENCOUNTER — Ambulatory Visit (INDEPENDENT_AMBULATORY_CARE_PROVIDER_SITE_OTHER): Payer: Self-pay | Admitting: Vascular Surgery

## 2023-01-03 DIAGNOSIS — I6529 Occlusion and stenosis of unspecified carotid artery: Secondary | ICD-10-CM

## 2023-01-03 NOTE — Progress Notes (Signed)
  ASSESSMENT/PLAN:: 89 y.o. male whom I called for follow-up with known carotid artery stenosis and hx of left CEA for asymptomatic carotid artery stenosis on 09/23/2019 by Dr. Arbie Cookey.    Recently, Roger Benjamin had a stroke with imaging demonstrating bilateral parietal infarcts.  Imaging also demonstrated bilateral severe subclavian artery stenoses.  Left ICA 60% stenosis, right ICA widely patent.  I had a long conversation with Roger Benjamin regarding the above.  I do not think that his recent stroke can be attributed to right sided ICA stenosis.  If this lesion was the culprit, the stroke distribution should not be bilateral.  I told him that I would discuss this further with Dr. Frances Furbish. I reached out to Dr. Frances Furbish who also felt that unilateral stenosis should not cause bilateral strokes.  This lesion will be treated is asymptomatic.  With his medical comorbidities and age, she was very wary about proceeding with any intervention.  Regarding bilateral subclavian artery stenosis, Roger Benjamin is asymptomatic.  He has palpable pulses in bilateral upper extremities.  No vertebrobasilar symptoms.  No effort-induced presyncopal or syncopal episodes.  No plan for intervention.  I plan to define the right-sided ICA stenosis as asymptomatic at this time.  Will continue to follow this on a every 6 month basis.  Mehra main concern is maintaining his independence.  He has not been driving at the request of Dr. Frances Furbish, who recommended that he stop.  Unfortunately, he does not have any help otherwise.  He wants to maintain his independence.  I asked that he reach out to Dr. Thornell Mule.   [facility] [phone]

## 2023-01-18 ENCOUNTER — Other Ambulatory Visit: Payer: Self-pay

## 2023-01-18 DIAGNOSIS — I6529 Occlusion and stenosis of unspecified carotid artery: Secondary | ICD-10-CM

## 2023-03-29 NOTE — Progress Notes (Signed)
 Cardiology Office Note:   Date:  04/04/2023  ID:  Roger Benjamin, DOB [birth year], MRN [id number] PCP:  Valentin Skates, DO  [facility] Providers Cardiologist:  Wendel Haws, MD Referring MD: Valentin Skates, DO  Chief Complaint/Reason for Referral: Cardiology follow-up ASSESSMENT:    1. Cerebrovascular accident (CVA), unspecified mechanism (HCC)   2. Coronary artery calcification seen on CAT scan   3. Hyperlipidemia LDL goal <55   4. Aortic atherosclerosis (HCC)   5. Primary hypertension   6. Trifascicular block   7. Bilateral carotid artery stenosis   8. Subclavian arterial stenosis (HCC)     PLAN:   In order of problems listed above: Stroke: The patient developed a stroke with bilateral acute infarctions.  This is not thought to be due to his peripheral arterial disease.  Will place a 30-day monitor to evaluate for occult atrial fibrillation.  Per neurology stop aspirin  and start indefinite Plavix  monotherapy. Coronary artery calcification: Continue Plavix , statin, and strict blood pressure control. Hyperlipidemia: Given history of stroke and peripheral arterial disease patient's goal LDL is less than 55.  Check lipid panel, LFTs, and LP(a) today. Aortic atherosclerosis: Continue Plavix , statin, and strict blood pressure control. Hypertension: Blood pressure is well-controlled today. Trifascicular block: Seen by EP.  No need for pacemaker at that time.  Monitor for symptoms. Bilateral carotid stenoses: Followed by Dr. Silver.  Continue medical therapy. Bilateral subclavian arterial stenoses: Was asymptomatic when he saw Dr. Silver.  Monitor for symptoms.            Dispo:  Return in about 6 months (around 10/02/2023).      Medication Adjustments/Labs and Tests Ordered: Current medicines are reviewed at length with the patient today.  Concerns regarding medicines are outlined above.  The following changes have been made:     Labs/tests ordered: Orders Placed This  Encounter  Procedures   Lipid panel   Hepatic function panel   Lipoprotein A (LPA)   CARDIAC EVENT MONITOR    Medication Changes: No orders of the defined types were placed in this encounter.   Current medicines are reviewed at length with the patient today.  The patient does not have concerns regarding medicines.  I spent 32 minutes reviewing all clinical data during and prior to this visit including all relevant imaging studies, laboratories, clinical information from other health systems and prior notes from both Cardiology and other specialties, interviewing the patient, conducting a complete physical examination, and coordinating care in order to formulate a comprehensive and personalized evaluation and treatment plan.   History of Present Illness:      FOCUSED PROBLEM LIST:   Coronary artery calcification Chest CT 2022 Hyperlipidemia Aortic atherosclerosis Chest CT 2022 Carotid stenosis Status post left CEA High-grade right carotid disease CT angio October 2024 Peripheral arterial disease Bilateral subclavian stenoses CT angio October 2024 Hypertension Trifascicular block 1 AVB +  RBBB + LAFB Second-degree Mobitz type I monitor August 2024 EP evaluation >> asymptomatic and no need for pacemaker September 2024 Acute stroke October 2024 MRI acute ischemic infarctions bilateral cerebral hemispheres CTA severe bilateral subclavian artery stenosis Followed by vascular surgery  7/24:  The patient is a 89 y.o. male with the indicated medical history here for recommendations regarding weakness.  The patient was seen by the primary care provider recently.  He had episodes of weakness with occasional lightheadedness.  His metoprolol  was decreased and eventually stopped.  There was concern about chronotropic incompetence.  This is why is here today.  His EKG today demonstrates trifascicular block.   He tells me that over the last several weeks he has had increasing fatigue and  lightheadedness.  He normally walks at the gym.  He has noticed that when he tries to do this he cannot do as much as he used to be able to do.  He also has noticed that when he goes from sitting to standing he will feel lightheaded.  This occasionally happens when he is walking as well.  He fortunately does not develop any shortness of breath or exertional angina.  He has not developed frank syncope.  He denies any peripheral edema or paroxysmal nocturnal dyspnea.  Plan: Check CMP, CBC, reflex TSH, monitor, echocardiogram, and refer to EP given symptoms of fatigue and trifascicular block.  2/25: In the interim the patient was seen by Dr. Waddell regarding Mobitz type I block on his monitor.  No further recommendations were offered as the patient was asymptomatic.  Unfortunately in October 2024 he developed an acute stroke in October 2024.  His MRI demonstrated bilateral acute ischemic infarctions.  A CTA demonstrated bilateral subclavian disease and a high-grade right carotid stenosis.  He was seen by Dr. Silver who did not think his a high-grade right stenosis would have contributed to bilateral infarctions.  He was judged to be asymptomatic in regards to his subclavian disease so medical therapy was pursued.  He was seen by cardiology in October and was doing well.  No changes were made to his medical therapy.    The patient has had a hard time getting back to his baseline functional capacity.  He attributes some of this to contracting COVID some years ago.  He will be seeing a specialist about this.  He fortunately is not developed any recurrent signs or symptoms of stroke.  He is currently off aspirin .  He is tolerating his Crestor  well.  He denies any chest pain or shortness of breath.  He has had no palpitations per se.  He tells me that his blood pressures are well-controlled at home.  He is otherwise well aside from feeling a little bit more tired with activity.          Current  Medications: Current Meds  Medication Sig   b complex vitamins capsule Take 1 capsule by mouth daily.   Cholecalciferol (VITAMIN D-3 PO) Take 1 capsule by mouth daily at 6 (six) AM.   clopidogrel  (PLAVIX ) 75 MG tablet Take 75 mg by mouth daily. Pt takes 1/2 tablet at night.   Cyanocobalamin (VITAMIN B 12 PO) Take 1 tablet by mouth daily.   finasteride  (PROSCAR ) 5 MG tablet Take 5 mg by mouth daily.   losartan  (COZAAR ) 25 MG tablet Take 0.5 tablets (12.5 mg total) by mouth at bedtime.   metoprolol  succinate (TOPROL -XL) 25 MG 24 hr tablet TAKE ONE TABLET BY MOUTH ONCE DAILY Oral for 90 Days   Multiple Vitamins-Minerals (MULTIVITAMIN WITH MINERALS) tablet Take 1 tablet by mouth daily.   rosuvastatin  (CRESTOR ) 5 MG tablet Take 20 mg by mouth daily.   tamsulosin (FLOMAX) 0.4 MG CAPS capsule Take 0.4 mg by mouth daily.   triamcinolone (KENALOG) 0.025 % cream Apply 1 Application topically as needed.   [DISCONTINUED] aspirin  EC 81 MG tablet Take 81 mg by mouth every other day. Swallow whole.     Review of Systems:   Please see the history of present illness.    All other systems reviewed and are negative.     EKGs/Labs/Other Test  Reviewed:   EKG: EKG from October 2024 demonstrates sinus rhythm with trifascicular block  EKG Interpretation Date/Time:    Ventricular Rate:    PR Interval:    QRS Duration:    QT Interval:    QTC Calculation:   R Axis:      Text Interpretation:           Risk Assessment/Calculations:          Physical Exam:   VS:  BP 130/75   Pulse 64   Ht 6' 1 (1.854 m)   Wt 183 lb 3.2 oz (83.1 kg)   SpO2 96%   BMI 24.17 kg/m        Wt Readings from Last 3 Encounters:  04/04/23 183 lb 3.2 oz (83.1 kg)  12/27/22 185 lb (83.9 kg)  12/27/22 186 lb 12.8 oz (84.7 kg)      GENERAL:  No apparent distress, AOx3 HEENT:  No carotid bruits, +2 carotid impulses, no scleral icterus CAR: RRR no murmurs, gallops, rubs, or thrills RES:  Clear to auscultation  bilaterally ABD:  Soft, nontender, nondistended, positive bowel sounds x 4 VASC:  +2 radial pulses, +2 carotid pulses NEURO:  CN 2-12 grossly intact; motor and sensory grossly intact PSYCH:  No active depression or anxiety EXT:  No edema, ecchymosis, or cyanosis  Signed, Ronee Ranganathan K Raesean Bartoletti, MD  04/04/2023 10:18 AM    [facility] [address] [place], [place], [place]  [address] Phone: [phone]; Fax: [phone]   Note:  This document was prepared using Dragon voice recognition software and may include unintentional dictation errors.

## 2023-04-04 ENCOUNTER — Telehealth: Payer: Self-pay | Admitting: Internal Medicine

## 2023-04-04 ENCOUNTER — Ambulatory Visit (INDEPENDENT_AMBULATORY_CARE_PROVIDER_SITE_OTHER): Payer: Medicare Other

## 2023-04-04 ENCOUNTER — Encounter: Payer: Self-pay | Admitting: *Deleted

## 2023-04-04 ENCOUNTER — Ambulatory Visit: Payer: Medicare Other | Attending: Internal Medicine | Admitting: Internal Medicine

## 2023-04-04 ENCOUNTER — Encounter: Payer: Self-pay | Admitting: Internal Medicine

## 2023-04-04 VITALS — BP 130/75 | HR 64 | Ht 73.0 in | Wt 183.2 lb

## 2023-04-04 DIAGNOSIS — I2510 Atherosclerotic heart disease of native coronary artery without angina pectoris: Secondary | ICD-10-CM

## 2023-04-04 DIAGNOSIS — I639 Cerebral infarction, unspecified: Secondary | ICD-10-CM

## 2023-04-04 DIAGNOSIS — I10 Essential (primary) hypertension: Secondary | ICD-10-CM

## 2023-04-04 DIAGNOSIS — E785 Hyperlipidemia, unspecified: Secondary | ICD-10-CM

## 2023-04-04 DIAGNOSIS — I6523 Occlusion and stenosis of bilateral carotid arteries: Secondary | ICD-10-CM | POA: Diagnosis present

## 2023-04-04 DIAGNOSIS — I453 Trifascicular block: Secondary | ICD-10-CM

## 2023-04-04 DIAGNOSIS — I771 Stricture of artery: Secondary | ICD-10-CM

## 2023-04-04 DIAGNOSIS — I700 Atherosclerosis of aorta: Secondary | ICD-10-CM | POA: Diagnosis present

## 2023-04-04 NOTE — Telephone Encounter (Signed)
  1. Is this related to a heart monitor you are wearing?  (If the patient says no, please ask     if they are caling about ICD/pacemaker.) yes  2. What is your issue?? Wants to if pt needs to change out the sensor, requesting cb   Please route to covering RN/CMA/RMA for results. Route to monitor technicians or your monitor tech representative for your site for any technical concerns

## 2023-04-04 NOTE — Telephone Encounter (Signed)
 Roger Benjamin had left for the day.  Spoke with evening nurse, Roger Benjamin.  She took down instructions on how to remove [employer] patch, remove sensor from patch and charge for 90 minutes.  Also reviewed how to insert sensor into new patch and reapply to chest. Telephone number for customer service at [employer] under Help on monitor cell phone. Sensor will need to be charged every 5 days.  Roger Benjamin has an appointment Tuesday 04/09/23 at 9 to have sensor charged and replaced if they are not able to do it.

## 2023-04-04 NOTE — Progress Notes (Unsigned)
 [employer] event serial # [id number] from office inventory applied to patient. Patient scheduled to have patch replaced 04/09/23.

## 2023-04-04 NOTE — Patient Instructions (Addendum)
 Medication Instructions:  Your physician has recommended you make the following change in your medication:  1) STOP aspirin  81 mg (this has been removed from your medication list)  *If you need a refill on your cardiac medications before your next appointment, please call your pharmacy*  Lab Work: TODAY (go to 1st floor, [address]): Lipid panel, LFTs, LP(a) If you have labs (blood work) drawn today and your tests are completely normal, you will receive your results only by: MyChart Message (if you have MyChart) OR A paper copy in the mail If you have any lab test that is abnormal or we need to change your treatment, we will call you to review the results.  Testing/Procedures: Your physician has requested that you wear a 30 day cardiac event monitor.  Follow-Up: At [facility], you and your health needs are our priority.  As part of our continuing mission to provide you with exceptional heart care, we have created designated Provider Care Teams.  These Care Teams include your primary Cardiologist (physician) and Advanced Practice Providers (APPs -  Physician Assistants and Nurse Practitioners) who all work together to provide you with the care you need, when you need it.  Your next appointment:   6 month(s)  The format for your next appointment:   In Person  Provider:   Orren Fabry, PA-C    Other Instructions [employer] Cardiac Event Monitor Instructions  Your physician has requested you wear your cardiac event monitor for 30 days, (1-30). [employer] may call or text to confirm a shipping address. The monitor will be sent to a land address via UPS. [employer] will not ship a monitor to a PO BOX. It typically takes 3-5 days to receive your monitor after it has been enrolled. [employer] will assist with USPS tracking if your package is delayed. The telephone number for [employer] is [phone]. Once you have received your monitor, please review the enclosed instructions.  Instruction tutorials can also be viewed under help and settings on the enclosed cell phone. Your monitor has already been registered assigning a specific monitor serial # to you.  Billing and Self Pay Discount Information  [employer] has been provided the insurance information we had on file for you.  If your insurance has been updated, please call [employer] at [phone] to provide them with your updated insurance information.   [employer] offers a discounted Self Pay option for patients who have insurance that does not cover their cardiac event monitor or patients without insurance.  The discounted cost of a Self Pay Cardiac Event Monitor would be $225.00 , if the patient contacts [employer] at [phone] within 7 days of applying the monitor to make payment arrangements.  If the patient does not contact [employer] within 7 days of applying the monitor, the cost of the cardiac event monitor will be $350.00.  Applying the monitor  Remove cell phone from case and turn it on. The cell phone works as it consultant and needs to be within [employer] of you at all times. The cell phone will need to be charged on a daily basis. We recommend you plug the cell phone into the enclosed charger at your bedside table every night.  Monitor batteries: You will receive two monitor batteries labelled #1 and #2. These are your recorders. Plug battery #2 onto the second connection on the enclosed charger. Keep one battery on the charger at all times. This will keep the monitor battery deactivated. It will also keep it fully charged for when  you need to switch your monitor batteries. A small light will be blinking on the battery emblem when it is charging. The light on the battery emblem will remain on when the battery is fully charged.  Open package of a Monitor strip. Insert battery #1 into black hood on strip and gently squeeze monitor battery onto connection as indicated in instruction booklet. Set  aside while preparing skin.  Choose location for your strip, vertical or horizontal, as indicated in the instruction booklet. Shave to remove all hair from location. There cannot be any lotions, oils, powders, or colognes on skin where monitor is to be applied. Wipe skin clean with enclosed Saline wipe. Dry skin completely.  Peel paper labeled #1 off the back of the Monitor strip exposing the adhesive. Place the monitor on the chest in the vertical or horizontal position shown in the instruction booklet. One arrow on the monitor strip must be pointing upward. Carefully remove paper labeled #2, attaching remainder of strip to your skin. Try not to create any folds or wrinkles in the strip as you apply it.  Firmly press and release the circle in the center of the monitor battery. You will hear a small beep. This is turning the monitor battery on. The heart emblem on the monitor battery will light up every 5 seconds if the monitor battery in turned on and connected to the patient securely. Do not push and hold the circle down as this turns the monitor battery off. The cell phone will locate the monitor battery. A screen will appear on the cell phone checking the connection of your monitor strip. This may read poor connection initially but change to good connection within the next minute. Once your monitor accepts the connection you will hear a series of 3 beeps followed by a climbing crescendo of beeps. A screen will appear on the cell phone showing the two monitor strip placement options. Touch the picture that demonstrates where you applied the monitor strip.  Your monitor strip and battery are waterproof. You are able to shower, bathe, or swim with the monitor on. They just ask you do not submerge deeper than 3 feet underwater. We recommend removing the monitor if you are swimming in a lake, river, or ocean.  Your monitor battery will need to be switched to a fully charged monitor battery  approximately once a week. The cell phone will alert you of an action which needs to be made.  On the cell phone, tap for details to reveal connection status, monitor battery status, and cell phone battery status. The green dots indicates your monitor is in good status. A red dot indicates there is something that needs your attention.  To record a symptom, click the circle on the monitor battery. In 30-60 seconds a list of symptoms will appear on the cell phone. Select your symptom and tap save. Your monitor will record a sustained or significant arrhythmia regardless of you clicking the button. Some patients do not feel the heart rhythm irregularities. [employer] will notify us  of any serious or critical events.  Refer to instruction booklet for instructions on switching batteries, changing strips, the Do not disturb or Pause features, or any additional questions.  Call [employer] at [phone], to confirm your monitor is transmitting and record your baseline. They will answer any questions you may have regarding the monitor instructions at that time.  Returning the monitor to [employer]  Place all equipment back into blue box. Peel off strip of paper to  expose adhesive and close box securely. There is a prepaid UPS shipping label on this box. Drop in a UPS drop box, or at a UPS facility like [employer]. You may also contact [employer] to arrange UPS to pick up monitor package at your home.      1st Floor: - Lobby - Registration  - Pharmacy  - Lab - Cafe  2nd Floor: - PV Lab - Diagnostic Testing (echo, CT, nuclear med)  3rd Floor: - Vacant  4th Floor: - TCTS (cardiothoracic surgery) - AFib Clinic - Structural Heart Clinic - Vascular Surgery  - Vascular Ultrasound  5th Floor: - HeartCare Cardiology (general and EP) - Clinical Pharmacy for coumadin, hypertension, lipid, weight-loss medications, and med management appointments    Valet parking services will be  available as well.

## 2023-04-05 ENCOUNTER — Telehealth: Payer: Self-pay | Admitting: Home Health

## 2023-04-05 ENCOUNTER — Telehealth: Payer: Self-pay | Admitting: Internal Medicine

## 2023-04-05 NOTE — Telephone Encounter (Signed)
 [employer] is calling with critical results.

## 2023-04-05 NOTE — Telephone Encounter (Signed)
 Calling with abn results. I tried to call Triage and no one answered. [employer] hung up

## 2023-04-05 NOTE — Telephone Encounter (Signed)
 Received critical monitor finding from 2:40 pm this afternoon, showing 5.1 second pause, HR dropped to 10. Pt reports asymptomatic, unsure if he was napping at the time or not (but did say he naps in afternoon). Reviewed w/ DOD. Pt advised to keep appt w/ EP MD next week for further discussion if PPM needed. Patient verbalized understanding and agreeable to plan.

## 2023-04-05 NOTE — Telephone Encounter (Addendum)
   Cardiac Monitor Alert  Date of alert:  04/05/2023   Patient Name: Roger Benjamin  DOB: 06-16-1931  MRN: [id number]   [facility] Cardiologist: Arun K Thukkani, MD  [facility] EP:  None    Monitor Information: Cardiac Event Monitor [[employer]]  Reason:  palpitations Ordering provider:  Thukkani   Alert Complete Heart Block Pause(s) - Longest:  6.5 seconds  This is the 1st alert for this rhythm.   Next Cardiology Appointment   Date:  2/11  Provider:  Wendel  The patient was contacted today.  He is asymptomatic. Arrhythmia, symptoms and history reviewed with Doctor of the Day.  Plan:  to be determine.   Other: Received phone call from [facility] and have not received the faxed report yet.  They states at 06:40 am patient had a 6.5 second pause with underlying intermittent complete heart block with blocked PACs. Called and spoke with patient and he stated didn't feel anything at all. Feel good going to be heading to work out soon.   Molli Gethers A Daemian Gahm, RN  04/05/2023 9:30 AM

## 2023-04-05 NOTE — Telephone Encounter (Signed)
 [employer] monitor [employer] called after hour line:   Auto triggered event at 2:41pm today intermittent complete heart block with lowest 26bpm, pause lasting 5.1s. Patient was called by the monitor compant, he reported no symptoms, was exercising at the time.   Noted office note from RN today, with above reported abnormal tracing, had reviewed DOD and recommended patient to keep existing EP appointment next week for PPM discussion.   Will not call patient again as this was already addressed.

## 2023-04-05 NOTE — Addendum Note (Signed)
 Addended by: Alvenia Aus on: 04/05/2023 11:21 AM   Modules accepted: Orders

## 2023-04-05 NOTE — Telephone Encounter (Signed)
 Reviewed w/ DOD, Dr. Alveta. Spoke to pt who reports that he was awake at the time, but asymptomatic. Advised, per Dr. Alveta to stop Metoprolol  and appt w/ EP MD next week.   Pt reports that he is not taking Metoprolol  (he went through his medications while on the phone with me).  This was removed from his medication list. Pt scheduled to see Dr. Cindie 2/13 @ 3:15 pm for further discussion.  Aware that we will most likely have to wait for more monitor results/completion before making a final decision/recommendation. Patient verbalized understanding and agreeable to plan.

## 2023-04-09 ENCOUNTER — Ambulatory Visit: Payer: Medicare Other

## 2023-04-09 ENCOUNTER — Telehealth: Payer: Self-pay | Admitting: Internal Medicine

## 2023-04-09 NOTE — Telephone Encounter (Signed)
Patient called in to the say the nurse at the facility put her monitor on. Stated it should be working fine. Please advise

## 2023-04-09 NOTE — Telephone Encounter (Signed)
Called the patient.  He is wearing the monitor and has been since last Thursday.  He is aware of the EP appointment 04/11/23 that was made as a result of the pause.  He was no aware of the pause until monitor company and cone called him.

## 2023-04-11 ENCOUNTER — Ambulatory Visit: Payer: Medicare Other | Attending: Cardiology | Admitting: Cardiology

## 2023-04-11 ENCOUNTER — Encounter: Payer: Self-pay | Admitting: Cardiology

## 2023-04-11 VITALS — BP 104/64 | HR 57 | Ht 73.0 in | Wt 186.0 lb

## 2023-04-11 DIAGNOSIS — I10 Essential (primary) hypertension: Secondary | ICD-10-CM | POA: Diagnosis present

## 2023-04-11 DIAGNOSIS — R002 Palpitations: Secondary | ICD-10-CM | POA: Diagnosis present

## 2023-04-11 NOTE — H&P (View-Only) (Signed)
 Electrophysiology Office Follow up Visit Note:    Date:  04/11/2023   ID:  Roger Benjamin, DOB 1931-11-16, MRN [id number]  PCP:  Roger Ferretti, DO  [facility] Cardiologist:  Roger Pyo, MD  [facility] Electrophysiologist:  Roger Prude, MD    Interval History:     Roger Benjamin is a 89 y.o. male who presents for a follow up visit.   The patient has been followed in the past by Dr. Ladona Benjamin.  He last saw Dr. Ladona Benjamin November 13, 2022.  The patient has a history of tobacco use, hypertension, hyperlipidemia.  He had a heart monitor in the past that showed episodes of daytime heart block, 2-1 AV block.  At that appointment, Dr. Ladona Benjamin did not think there was an indication for permanent pacemaker implant.  Red flag symptoms such as syncope were discussed with the patient and he was to follow-up with general cardiology.  He saw Dr. Lynnette Benjamin April 04, 2023.  That appointment was after a stroke.  A 30-day heart monitor was placed to evaluate for possible atrial fibrillation.  With the monitor in place, the patient experienced prolonged daytime pauses.       Today he is with his daughter in clinic.  He reports episodes of near syncope while awake.  He says at times he will be watching television feel like he is going to lose consciousness.  He would like to to get back to being active.  He previously was going to the [facility] couple of times per week to exercise.  He has always been a very active man.      Past medical, surgical, social and family history were reviewed.  ROS:   Please see the history of present illness.    All other systems reviewed and are negative.  EKGs/Labs/Other Studies Reviewed:    The following studies were reviewed today:  [name] monitor personally reviewed.  Some of the strips are as above.  Paroxysmal AV block.  Ventricular standstill.  December 10, 2022 EKG shows sinus rhythm, prolonged Wenckebach sequence.  Ventricular rate 84 bpm        Physical Exam:    VS:  BP 104/64   Pulse (!) 57   Ht 6\' 1"  (1.854 m)   Wt 186 lb (84.4 kg)   SpO2 94%   BMI 24.54 kg/m     Wt Readings from Last 3 Encounters:  04/11/23 186 lb (84.4 kg)  04/04/23 183 lb 3.2 oz (83.1 kg)  12/27/22 185 lb (83.9 kg)     GEN: no distress.  Elderly CARD: RRR, No MRG RESP: No IWOB. CTAB.      ASSESSMENT:    1. Essential hypertension   2. Palpitations    PLAN:    In order of problems listed above:  #Bradycardia #Paroxysmal AV block Symptomatic.  Heart monitor shows paroxysmal complete heart block with ventricular standstill.  I have recommended permanent pacing and the patient and his family are in agreement.  Plan for implant in the next week or so.  He will hold his Plavix for 4 days prior to the procedure.  Risks, benefits, alternatives to PPM implantation were discussed in detail with the patient today. The patient understands that the risks include but are not limited to bleeding, infection, pneumothorax, perforation, tamponade, vascular damage, renal failure, MI, stroke, death, and lead dislodgement and wishes to proceed.  We will therefore schedule device implantation at the next available time.  Plan for dual-chamber permanent pacemaker.  Signed, Steffanie Dunn, MD, [facility], [facility] 04/11/2023 8:58 PM    Electrophysiology [facility]

## 2023-04-11 NOTE — Patient Instructions (Addendum)
Medication Instructions:  Your physician recommends that you continue on your current medications as directed. Please refer to the Current Medication list given to you today.  *If you need a refill on your cardiac medications before your next appointment, please call your pharmacy*  Lab Work: BMET and CBC  Testing/Procedures: Pacemaker Your physician has recommended that you have a pacemaker inserted. A pacemaker is a small device that is placed under the skin of your chest or abdomen to help control abnormal heart rhythms. This device uses electrical pulses to prompt the heart to beat at a normal rate. Pacemakers are used to treat heart rhythms that are too slow. Wire (leads) are attached to the pacemaker that goes into the chambers of you heart. This is done in the hospital and usually requires and overnight stay. Please see the instruction sheet given to you today for more information.  Follow-Up: At [facility], you and your health needs are our priority.  As part of our continuing mission to provide you with exceptional heart care, we have created designated Provider Care Teams.  These Care Teams include your primary Cardiologist (physician) and Advanced Practice Providers (APPs -  Physician Assistants and Nurse Practitioners) who all work together to provide you with the care you need, when you need it.  Your next appointment:   We will call you to arrange your follow up appointments

## 2023-04-11 NOTE — Progress Notes (Signed)
Electrophysiology Office Follow up Visit Note:    Date:  04/11/2023   ID:  Roger Benjamin, DOB 1931-11-16, MRN [id number]  PCP:  Roger Ferretti, DO  [facility] Cardiologist:  Roger Pyo, MD  [facility] Electrophysiologist:  Roger Prude, MD    Interval History:     Roger Benjamin is a 89 y.o. male who presents for a follow up visit.   The patient has been followed in the past by Dr. Ladona Benjamin.  He last saw Dr. Ladona Benjamin November 13, 2022.  The patient has a history of tobacco use, hypertension, hyperlipidemia.  He had a heart monitor in the past that showed episodes of daytime heart block, 2-1 AV block.  At that appointment, Dr. Ladona Benjamin did not think there was an indication for permanent pacemaker implant.  Red flag symptoms such as syncope were discussed with the patient and he was to follow-up with general cardiology.  He saw Dr. Lynnette Benjamin April 04, 2023.  That appointment was after a stroke.  A 30-day heart monitor was placed to evaluate for possible atrial fibrillation.  With the monitor in place, the patient experienced prolonged daytime pauses.       Today he is with his daughter in clinic.  He reports episodes of near syncope while awake.  He says at times he will be watching television feel like he is going to lose consciousness.  He would like to to get back to being active.  He previously was going to the [facility] couple of times per week to exercise.  He has always been a very active man.      Past medical, surgical, social and family history were reviewed.  ROS:   Please see the history of present illness.    All other systems reviewed and are negative.  EKGs/Labs/Other Studies Reviewed:    The following studies were reviewed today:  Roger Benjamin monitor personally reviewed.  Some of the strips are as above.  Paroxysmal AV block.  Ventricular standstill.  December 10, 2022 EKG shows sinus rhythm, prolonged Wenckebach sequence.  Ventricular rate 84 bpm        Physical Exam:    VS:  BP 104/64   Pulse (!) 57   Ht 6\' 1"  (1.854 m)   Wt 186 lb (84.4 kg)   SpO2 94%   BMI 24.54 kg/m     Wt Readings from Last 3 Encounters:  04/11/23 186 lb (84.4 kg)  04/04/23 183 lb 3.2 oz (83.1 kg)  12/27/22 185 lb (83.9 kg)     GEN: no distress.  Elderly CARD: RRR, No MRG RESP: No IWOB. CTAB.      ASSESSMENT:    1. Essential hypertension   2. Palpitations    PLAN:    In order of problems listed above:  #Bradycardia #Paroxysmal AV block Symptomatic.  Heart monitor shows paroxysmal complete heart block with ventricular standstill.  I have recommended permanent pacing and the patient and his family are in agreement.  Plan for implant in the next week or so.  He will hold his Plavix for 4 days prior to the procedure.  Risks, benefits, alternatives to PPM implantation were discussed in detail with the patient today. The patient understands that the risks include but are not limited to bleeding, infection, pneumothorax, perforation, tamponade, vascular damage, renal failure, MI, stroke, death, and lead dislodgement and wishes to proceed.  We will therefore schedule device implantation at the next available time.  Plan for dual-chamber permanent pacemaker.  Signed, Roger Dunn, MD, [facility], [facility] 04/11/2023 8:58 PM    Electrophysiology [facility]

## 2023-04-12 ENCOUNTER — Telehealth: Payer: Self-pay | Admitting: Cardiology

## 2023-04-12 NOTE — Telephone Encounter (Signed)
Pt's daughter requesting cb from Dr Lovena Neighbours nurse in ref to surgery stay and pt not taking Losartan, pt decline/memory issues

## 2023-04-12 NOTE — Telephone Encounter (Addendum)
Patient's daughter called relaying a message from the patient's wife.  They wanted to make sure that we knew what happened while he was at [facility] in October.   He developed hospital psychosis and it started when they wouldn't let him have bourbon.  Became delusional and violent. He has alcohol dependency.    It was mentioned during the visit he may be admitted overnight and they feel it would be in everyone's best interest he stayed overnight.   He has not taken losartan in for sure a week.  Daughter was reviewing his meds this morning.    She wants to make sure Dr. Lalla Brothers is aware he has been off and his BP at office visit was not due to losartan.  (104/64)

## 2023-04-14 LAB — LIPID PANEL
Chol/HDL Ratio: 2 {ratio} (ref 0.0–5.0)
Cholesterol, Total: 164 mg/dL (ref 100–199)
HDL: 84 mg/dL (ref >39)
LDL Chol Calc (NIH): 69 mg/dL (ref 0–99)
Triglycerides: 55 mg/dL (ref 0–149)
VLDL Cholesterol Cal: 11 mg/dL (ref 5–40)

## 2023-04-14 LAB — HEPATIC FUNCTION PANEL
ALT: 20 [IU]/L (ref 0–44)
AST: 31 [IU]/L (ref 0–40)
Albumin: 4.5 g/dL (ref 3.7–4.7)
Alkaline Phosphatase: 54 [IU]/L (ref 44–121)
Bilirubin Total: 0.5 mg/dL (ref 0.0–1.2)
Bilirubin, Direct: 0.14 mg/dL (ref 0.00–0.40)
Total Protein: 7 g/dL (ref 6.0–8.5)

## 2023-04-14 LAB — LIPOPROTEIN A (LPA)

## 2023-04-15 ENCOUNTER — Telehealth: Payer: Self-pay | Admitting: Cardiology

## 2023-04-15 ENCOUNTER — Encounter: Payer: Self-pay | Admitting: Internal Medicine

## 2023-04-15 DIAGNOSIS — I10 Essential (primary) hypertension: Secondary | ICD-10-CM

## 2023-04-15 DIAGNOSIS — R002 Palpitations: Secondary | ICD-10-CM

## 2023-04-15 NOTE — Telephone Encounter (Signed)
Spoke with daughter per DPR and she states patient fell this morning. His ex wife called her at 6 am He was dizzy and fell. Patient did not lose consciousness. He did not hit his head. He lives in independent living so there is no nurse before 8 am. Nurse took his BP 167/71 HR 63 at 830 am. He states he feels better now.  He was supposed to get labs down today for pacemaker implant on Thursday. He is not able to drive himself because of the dizzy spells and the ex wife is afraid he will get dizzy, fall while they are out and she is unable to help him up. Patient can not monitor HR and BP on his own.  Did advise for him to change positions slowly. When he feels dizzy to have a seat. She states the facility have a wellness Center he can stay at until He have the procedure and someone will be able to monitor whim and be with just incase he falls. Daughter would like to know if you think he should utilize the [employer]. She also is trying to see if the facility can draw his labs. She lives 5 hours away and wont be here until Wednesday afternoon.

## 2023-04-15 NOTE — Telephone Encounter (Signed)
STAT if patient feels like he/she is going to faint   Are you dizzy now?  Daughter is 5 hours away + not currently with patient. She states incident occurred 2 hours ago.  Do you feel faint or have you passed out?  Daughter states patient told her he did not lose consciousness  Do you have any other symptoms?    Have you checked your HR and BP (record if available)?   Daughter states patient fell this morning after becoming dizzy. She states he has increased dizziness and weakness. He lives alone, in independent living at [place]. Security found patient and helped get him up and nurse is with him now and will take his BP. Daughter mentions that the reason patient was up is because he keeps feeling like he needs to have a bowel movement, but he can't go when he tried. Daughter unsure whether this is related to dizziness/weakness. Daughter would like to know what to do between now and Thursday. Any recommendations? Will this interfere with procedure? Please advise.

## 2023-04-16 NOTE — Telephone Encounter (Signed)
Spoke with the patient's daughter who states that the patient has not had any further episodes and is feeling stronger today. He has been advised by the staff at the nursing home to rest and not wander the halls. They are bringing him his meals to his room for now. He is also going to have his labs drawn today and they are continuing to monitor his vital signs. They will keep plan as scheduled for pacemaker implant on 2/20.

## 2023-04-17 LAB — CBC WITH DIFFERENTIAL/PLATELET
Basophils Absolute: 0.1 10*3/uL (ref 0.0–0.2)
Basos: 1 %
EOS (ABSOLUTE): 0.3 10*3/uL (ref 0.0–0.4)
Eos: 3 %
Hematocrit: 44.8 % (ref 37.5–51.0)
Hemoglobin: 15.1 g/dL (ref 13.0–17.7)
Immature Grans (Abs): 0 10*3/uL (ref 0.0–0.1)
Immature Granulocytes: 0 %
Lymphocytes Absolute: 1.5 10*3/uL (ref 0.7–3.1)
Lymphs: 13 %
MCH: 31.1 pg (ref 26.6–33.0)
MCHC: 33.7 g/dL (ref 31.5–35.7)
MCV: 92 fL (ref 79–97)
Monocytes Absolute: 1.6 10*3/uL — ABNORMAL HIGH (ref 0.1–0.9)
Monocytes: 14 %
Neutrophils Absolute: 7.9 10*3/uL — ABNORMAL HIGH (ref 1.4–7.0)
Neutrophils: 69 %
Platelets: 251 10*3/uL (ref 150–450)
RBC: 4.85 x10E6/uL (ref 4.14–5.80)
RDW: 11.9 % (ref 11.6–15.4)
WBC: 11.4 10*3/uL — ABNORMAL HIGH (ref 3.4–10.8)

## 2023-04-17 LAB — BASIC METABOLIC PANEL
BUN/Creatinine Ratio: 17 (ref 10–24)
BUN: 16 mg/dL (ref 8–27)
CO2: 23 mmol/L (ref 20–29)
Calcium: 9.5 mg/dL (ref 8.6–10.2)
Chloride: 97 mmol/L (ref 96–106)
Creatinine, Ser: 0.93 mg/dL (ref 0.76–1.27)
Glucose: 119 mg/dL — ABNORMAL HIGH (ref 70–99)
Potassium: 4.2 mmol/L (ref 3.5–5.2)
Sodium: 135 mmol/L (ref 134–144)
eGFR: 78 mL/min/{1.73_m2} (ref >59)

## 2023-04-17 NOTE — Pre-Procedure Instructions (Signed)
Instructed patient on the following items: Arrival time 1230 Nothing to eat or drink after midnight No meds AM of procedure Responsible person to drive you home and stay with you for 24 hrs Wash with special soap night before and morning of procedure If on anti-coagulant drug instructions Plavix- last dose was 2/15

## 2023-04-18 ENCOUNTER — Encounter (HOSPITAL_COMMUNITY)
Admission: RE | Disposition: A | Discharge status: Other Acute Inpatient Hospital | Payer: Self-pay | Source: Home / Self Care | Attending: Cardiology

## 2023-04-18 ENCOUNTER — Ambulatory Visit (HOSPITAL_COMMUNITY)
Admission: RE | Admit: 2023-04-18 | Discharge: 2023-04-18 | Payer: Medicare Other | Attending: Cardiology | Admitting: Cardiology

## 2023-04-18 ENCOUNTER — Ambulatory Visit (HOSPITAL_COMMUNITY): Payer: Medicare Other

## 2023-04-18 ENCOUNTER — Other Ambulatory Visit: Payer: Self-pay

## 2023-04-18 DIAGNOSIS — I442 Atrioventricular block, complete: Secondary | ICD-10-CM | POA: Diagnosis present

## 2023-04-18 DIAGNOSIS — Z79899 Other long term (current) drug therapy: Secondary | ICD-10-CM | POA: Diagnosis not present

## 2023-04-18 DIAGNOSIS — E785 Hyperlipidemia, unspecified: Secondary | ICD-10-CM | POA: Insufficient documentation

## 2023-04-18 DIAGNOSIS — Z87891 Personal history of nicotine dependence: Secondary | ICD-10-CM | POA: Diagnosis not present

## 2023-04-18 DIAGNOSIS — I10 Essential (primary) hypertension: Secondary | ICD-10-CM | POA: Diagnosis not present

## 2023-04-18 DIAGNOSIS — R001 Bradycardia, unspecified: Secondary | ICD-10-CM | POA: Insufficient documentation

## 2023-04-18 DIAGNOSIS — Z7902 Long term (current) use of antithrombotics/antiplatelets: Secondary | ICD-10-CM | POA: Diagnosis not present

## 2023-04-18 HISTORY — PX: PACEMAKER IMPLANT: EP1218

## 2023-04-18 SURGERY — PACEMAKER IMPLANT

## 2023-04-18 MED ORDER — LIDOCAINE HCL (PF) 1 % IJ SOLN
INTRAMUSCULAR | Status: AC
  Filled 2023-04-18: qty 60

## 2023-04-18 MED ORDER — CEFAZOLIN SODIUM-DEXTROSE 2-4 GM/100ML-% IV SOLN
2.0000 g | INTRAVENOUS | Status: AC
  Administered 2023-04-18: 2 g via INTRAVENOUS

## 2023-04-18 MED ORDER — SODIUM CHLORIDE 0.9 % IV SOLN
INTRAVENOUS | Status: AC
  Filled 2023-04-18: qty 2

## 2023-04-18 MED ORDER — SODIUM CHLORIDE 0.9 % IV SOLN
80.0000 mg | INTRAVENOUS | Status: AC
  Administered 2023-04-18: 80 mg

## 2023-04-18 MED ORDER — POVIDONE-IODINE 10 % EX SWAB
2.0000 | Freq: Once | CUTANEOUS | Status: AC
  Administered 2023-04-18: 2 via TOPICAL

## 2023-04-18 MED ORDER — HEPARIN (PORCINE) IN NACL 1000-0.9 UT/500ML-% IV SOLN
INTRAVENOUS | Status: DC | PRN
  Administered 2023-04-18: 500 mL

## 2023-04-18 MED ORDER — SODIUM CHLORIDE 0.9 % IV SOLN
INTRAVENOUS | Status: DC | PRN
  Administered 2023-04-18: 150 mL via INTRAVENOUS

## 2023-04-18 MED ORDER — CEFAZOLIN SODIUM-DEXTROSE 2-4 GM/100ML-% IV SOLN
INTRAVENOUS | Status: DC
Start: 2023-04-18 — End: 2023-04-19
  Filled 2023-04-18: qty 100

## 2023-04-18 MED ORDER — ACETAMINOPHEN 325 MG PO TABS: 325.0000 mg | ORAL_TABLET | ORAL | Status: DC | PRN

## 2023-04-18 MED ORDER — ONDANSETRON HCL 4 MG/2ML IJ SOLN: 4.0000 mg | Freq: Four times a day (QID) | INTRAMUSCULAR | Status: DC | PRN

## 2023-04-18 MED ORDER — LIDOCAINE HCL (PF) 1 % IJ SOLN
INTRAMUSCULAR | Status: DC | PRN
Start: 2023-04-18 — End: 2023-04-18
  Administered 2023-04-18: 60 mL

## 2023-04-18 MED ORDER — SODIUM CHLORIDE 0.9 % IV SOLN: INTRAVENOUS | Status: DC

## 2023-04-18 MED ORDER — CHLORHEXIDINE GLUCONATE 4 % EX SOLN
4.0000 | Freq: Once | CUTANEOUS | Status: DC
  Filled 2023-04-18: qty 60

## 2023-04-18 SURGICAL SUPPLY — 12 items
CABLE SURGICAL S-101-97-12 (CABLE) ×1 IMPLANT
KIT ACCESSORY SELECTRA FIX CVD (MISCELLANEOUS) IMPLANT
LEAD SELECTRA 3D-55-42 (CATHETERS) IMPLANT
LEAD SOLIA S PRO MRI 53 (Lead) IMPLANT
LEAD SOLIA S PRO MRI 60 (Lead) IMPLANT
PACEMAKER AMVIA DR-T 460163 (Pacemaker) IMPLANT
PAD DEFIB RADIO PHYSIO CONN (PAD) ×1 IMPLANT
PPM AMVIA DR-T 460163 (Pacemaker) ×1 IMPLANT
SHEATH 7FR PRELUDE SNAP 13 (SHEATH) IMPLANT
SHEATH 9FR PRELUDE SNAP 13 (SHEATH) IMPLANT
SHEATH PROBE COVER 6X72 (BAG) IMPLANT
TRAY PACEMAKER INSERTION (PACKS) ×1 IMPLANT

## 2023-04-18 NOTE — Interval H&P Note (Signed)
History and Physical Interval Note:  04/18/2023 12:40 PM  Roger Benjamin  has presented today for surgery, with the diagnosis of bradicardia.  The various methods of treatment have been discussed with the patient and family. After consideration of risks, benefits and other options for treatment, the patient has consented to  Procedure(s): PACEMAKER IMPLANT (N/A) as a surgical intervention.  The patient's history has been reviewed, patient examined, no change in status, stable for surgery.  I have reviewed the patient's chart and labs.  Questions were answered to the patient's satisfaction.     Kolina Kube T Shaine Mount

## 2023-04-18 NOTE — Discharge Instructions (Addendum)
After Your Pacemaker   You have a Biotronik Pacemaker  ACTIVITY Do not lift your arm above shoulder height for 1 week after your procedure. After 7 days, you may progress as below.  You should remove your sling 24 hours after your procedure, unless otherwise instructed by your provider.     Thursday April 25, 2023  Friday April 26, 2023 Saturday April 27, 2023 Sunday April 28, 2023   Do not lift, push, pull, or carry anything over 10 pounds with the affected arm until 6 weeks (Thursday May 30, 2023 ) after your procedure.   You may drive AFTER your wound check, unless you have been told otherwise by your provider.   Ask your healthcare provider when you can go back to work   INCISION/Dressing Hold plavix post procedure.  Ok to restart on 04/24/23  If large square, outer bandage is left in place, this can be removed after 24 hours from your procedure. Do not remove steri-strips or glue as below.   If a PRESSURE DRESSING (a bulky dressing that usually goes up over your shoulder) was applied or left in place, please follow instructions given by your provider on when to return to have this removed.   Monitor your Pacemaker site for redness, swelling, and drainage. Call the device clinic at [phone] if you experience these symptoms or fever/chills.  If your incision is sealed with Steri-strips or staples, you may shower 7 days after your procedure or when told by your provider. Do not remove the steri-strips or let the shower hit directly on your site. You may wash around your site with soap and water.    If you were discharged in a sling, please do not wear this during the day more than 48 hours after your surgery unless otherwise instructed. This may increase the risk of stiffness and soreness in your shoulder.   Avoid lotions, ointments, or perfumes over your incision until it is well-healed.  You may use a hot tub or a pool AFTER your wound check appointment if the incision  is completely closed.  Pacemaker Alerts:  Some alerts are vibratory and others beep. These are NOT emergencies. Please call our office to let us know. If this occurs at night or on weekends, it can wait until the next business day. Send a remote transmission.  If your device is capable of reading fluid status (for heart failure), you will be offered monthly monitoring to review this with you.   DEVICE MANAGEMENT Remote monitoring is used to monitor your pacemaker from home. This monitoring is scheduled every 91 days by our office. It allows [place] to keep an eye on the functioning of your device to ensure it is working properly. You will routinely see your Electrophysiologist annually (more often if necessary).   You should receive your ID card for your new device in 4-8 weeks. Keep this card with you at all times once received. Consider wearing a medical alert bracelet or necklace.  Your Pacemaker may be MRI compatible. This will be discussed at your next office visit/wound check.  You should avoid contact with strong electric or magnetic fields.   Do not use amateur (ham) radio equipment or electric (arc) welding torches. MP3 player headphones with magnets should not be used. Some devices are safe to use if held at least 12 inches (30 cm) from your Pacemaker. These include power tools, lawn mowers, and speakers. If you are unsure if something is safe to use, ask your health  care provider.  When using your cell phone, hold it to the ear that is on the opposite side from the Pacemaker. Do not leave your cell phone in a pocket over the Pacemaker.  You may safely use electric blankets, heating pads, computers, and microwave ovens.  Call the office right away if: You have chest pain. You feel more short of breath than you have felt before. You feel more light-headed than you have felt before. Your incision starts to open up.  This information is not intended to replace advice given to you by your  health care provider. Make sure you discuss any questions you have with your health care provider.

## 2023-04-19 ENCOUNTER — Telehealth: Payer: Self-pay | Admitting: Cardiology

## 2023-04-19 ENCOUNTER — Encounter (HOSPITAL_COMMUNITY): Payer: Self-pay | Admitting: Cardiology

## 2023-04-19 NOTE — Telephone Encounter (Signed)
Patient's daughter called stating her father had a pace maker implanted yesterday.  She stated she was told he couldn't left his arm above his head for 4-6, but the discharge papers says 1-wk.  She was calling to get clarification.

## 2023-04-19 NOTE — Telephone Encounter (Signed)
Patient advised the pictures on his AVS printed are the pictures he should follow as the max height by the particular date below the picture. Advised pt is he or family has further questions to give [place] a call. Voiced understanding/appreciative of call.

## 2023-04-26 ENCOUNTER — Telehealth: Payer: Self-pay

## 2023-04-26 DIAGNOSIS — Z79899 Other long term (current) drug therapy: Secondary | ICD-10-CM

## 2023-04-26 DIAGNOSIS — E785 Hyperlipidemia, unspecified: Secondary | ICD-10-CM

## 2023-04-26 MED ORDER — ROSUVASTATIN CALCIUM 40 MG PO TABS: 40.0000 mg | ORAL_TABLET | Freq: Every day | ORAL | 3 refills | Status: DC

## 2023-04-26 NOTE — Telephone Encounter (Signed)
 Called and spoke with patient who verbalized understanding. He will use up what he has and double up, new prescription sent to pharmacy per request. Repeat labs entered at this time and released. Message sent to [facility] so that she can call him in may to remind him to get labs done.

## 2023-04-26 NOTE — Telephone Encounter (Signed)
 Pt already taking Crestor 20mg  daily. Routing to MD to clarify if he meant to increase to 40mg  daily.

## 2023-04-26 NOTE — Telephone Encounter (Signed)
-----   Message from Orbie Pyo sent at 04/15/2023  8:11 AM EST ----- Increase crestor to 10mg , lipids/lfts/LpA in 2 months.

## 2023-05-02 ENCOUNTER — Ambulatory Visit: Payer: Medicare Other | Attending: Cardiovascular Disease

## 2023-05-02 ENCOUNTER — Ambulatory Visit: Payer: Medicare Other

## 2023-05-02 DIAGNOSIS — R001 Bradycardia, unspecified: Secondary | ICD-10-CM | POA: Diagnosis not present

## 2023-05-02 NOTE — Patient Instructions (Signed)

## 2023-05-03 ENCOUNTER — Ambulatory Visit: Payer: Medicare Other | Admitting: Pulmonary Disease

## 2023-05-04 LAB — CUP PACEART INCLINIC DEVICE CHECK
Brady Statistic RA Percent Paced: 69 %
Brady Statistic RV Percent Paced: 66 %
Date Time Interrogation Session: 20250306202543
Implantable Lead Connection Status: 753985
Implantable Lead Connection Status: 753985
Implantable Lead Implant Date: 20250220
Implantable Lead Implant Date: 20250220
Implantable Lead Location: 753859
Implantable Lead Location: 753860
Implantable Lead Model: 377
Implantable Lead Model: 377
Implantable Lead Serial Number: 8001757323
Implantable Lead Serial Number: 8001819466
Implantable Pulse Generator Implant Date: 20250220
Pulse Gen Serial Number: 1000449892

## 2023-05-04 NOTE — Progress Notes (Signed)
 Normal Pacemaker wound check. Wound well healed. Thresholds, sensing, and impedances consistent with implant measurements and at 3.5V safety margin/auto capture until 3 month visit. No episodes. Reviewed arm restrictions to continue for 6 weeks total post op.  Pt enrolled in remote follow-up.

## 2023-05-07 ENCOUNTER — Encounter: Payer: Self-pay | Admitting: Vascular Surgery

## 2023-05-09 ENCOUNTER — Encounter: Payer: Self-pay | Admitting: Internal Medicine

## 2023-05-09 ENCOUNTER — Encounter: Payer: Self-pay | Admitting: Cardiology

## 2023-05-09 NOTE — Telephone Encounter (Signed)
-----   Message from Orbie Pyo sent at 05/09/2023  7:40 AM EDT ----- Refer to EP

## 2023-05-30 ENCOUNTER — Ambulatory Visit (INDEPENDENT_AMBULATORY_CARE_PROVIDER_SITE_OTHER): Payer: Medicare Other

## 2023-05-30 DIAGNOSIS — I639 Cerebral infarction, unspecified: Secondary | ICD-10-CM

## 2023-05-30 LAB — CUP PACEART REMOTE DEVICE CHECK
Date Time Interrogation Session: 20250403083330
Implantable Lead Connection Status: 753985
Implantable Lead Connection Status: 753985
Implantable Lead Implant Date: 20250220
Implantable Lead Implant Date: 20250220
Implantable Lead Location: 753859
Implantable Lead Location: 753860
Implantable Lead Model: 377
Implantable Lead Model: 377
Implantable Lead Serial Number: 8001757323
Implantable Lead Serial Number: 8001819466
Implantable Pulse Generator Implant Date: 20250220
Pulse Gen Serial Number: 1000449892

## 2023-06-01 ENCOUNTER — Encounter: Payer: Self-pay | Admitting: Cardiology

## 2023-07-04 ENCOUNTER — Ambulatory Visit: Payer: Medicare Other | Admitting: Vascular Surgery

## 2023-07-04 ENCOUNTER — Encounter (HOSPITAL_COMMUNITY): Payer: Medicare Other

## 2023-07-08 NOTE — Progress Notes (Signed)
 Remote pacemaker transmission.

## 2023-07-09 NOTE — Progress Notes (Unsigned)
 HISTORY AND PHYSICAL    HPI: This is a 89 y.o. male here for follow up for carotid artery stenosis.  Pt is s/p left CEA for asymptomatic carotid artery stenosis on 09/23/2019 by Dr. Shirley Douglas.    At his 10/12/2021 visit he noted some dizziness and had a CTA that revealed right SCA stenosis and right CCA and ICA with less than 50% stenosis.  The left ICA did not have any recurrent stenosis.  His dizziness had improved with hydration and he was not having any other neurological sx.   Last year, Jaquavion had a stroke with bilateral parietal artery infarcts.  The right carotid was not the culprit as the infarcts were bilateral.    On exam today, Pt returns today for follow up.   Pt denies any amaurosis fugax, speech difficulties, weakness, numbness, paralysis or clumsiness or facial droop.   Has had a few episodes of waxing and waning weakness that he describes as just being very tired with low energy.  He denies unilateral deficits, no speech changes, clumsiness, ataxia, presyncopal episodes. He describes the fatigue episodes lasting for roughly a day.  He stated one happened a few days ago in which he was very tired at physical therapy, but the next day he felt great, with more energy than ever.   The pt is on a statin for cholesterol management.  The pt is on a daily aspirin .   Other AC:  none The pt is on BB for hypertension.   The pt does not have diabetes Tobacco hx:  former   Past Medical History:  Diagnosis Date   Atrioventricular block    1st degree   Carotid artery stenosis    Colon polyposis    Emphysema lung (HCC)    incidental finding on CT scan 01/2021   HLD (hyperlipidemia)    Hypertension    Unsure is on Metoprolol    Psoriasis    Skin cancer     Past Surgical History:  Procedure Laterality Date   ENDARTERECTOMY Left 09/23/2019   Procedure: ENDARTERECTOMY LEFT CAROTID with PATCH ANGIOPLASTY;  Surgeon: Mayo Speck, MD;  Location: [facility] OR;  Service: Vascular;  Laterality:  Left;   FRACTURE SURGERY Left    leg   PACEMAKER IMPLANT N/A 04/18/2023   Procedure: PACEMAKER IMPLANT;  Surgeon: Boyce Byes, MD;  Location: [facility] INVASIVE CV LAB;  Service: Cardiovascular;  Laterality: N/A;   SKIN CANCER EXCISION     TONSILLECTOMY     as a child    Allergies  Allergen Reactions   Sulfa  Antibiotics      stomach upset   Tape     Needs paper tape    Current Outpatient Medications  Medication Sig Dispense Refill   b complex vitamins capsule Take 1 capsule by mouth daily.     Cholecalciferol (VITAMIN D-3 PO) Take 1 capsule by mouth daily at 6 (six) AM.     clopidogrel  (PLAVIX ) 75 MG tablet Take 75 mg by mouth daily.     Cyanocobalamin (VITAMIN B 12 PO) Take 1 tablet by mouth daily.     losartan  (COZAAR ) 25 MG tablet Take 0.5 tablets (12.5 mg total) by mouth at bedtime. 45 tablet 3   Multiple Vitamins-Minerals (MULTIVITAMIN WITH MINERALS) tablet Take 1 tablet by mouth daily.     rosuvastatin  (CRESTOR ) 40 MG tablet Take 1 tablet (40 mg total) by mouth daily. 90 tablet 3   tamsulosin (FLOMAX) 0.4 MG CAPS capsule Take 0.4 mg by mouth daily.  triamcinolone (KENALOG) 0.025 % cream Apply 1 Application topically daily as needed (irritation).     No current facility-administered medications for this visit.    Family History  Problem Relation Age of Onset   Stroke Neg Hx     Social History   Socioeconomic History   Marital status: Married    Spouse name: Not on file   Number of children: Not on file   Years of education: Not on file   Highest education level: Not on file  Occupational History   Not on file  Tobacco Use   Smoking status: Former    Current packs/day: 0.00    Average packs/day: 3.0 packs/day for 30.0 years (90.0 ttl pk-yrs)    Types: Cigarettes    Start date: 24    Quit date: 02/27/1980    Years since quitting: 43.3   Smokeless tobacco: Never  Vaping Use   Vaping status: Never Used  Substance and Sexual Activity   Alcohol use: Yes     Alcohol/week: 7.0 standard drinks of alcohol    Types: 7 Shots of liquor per week    Comment: 1 drink daily    Drug use: Never   Sexual activity: Not on file  Other Topics Concern   Not on file  Social History Narrative   Not on file   Social Drivers of Health   Financial Resource Strain: Not on file  Food Insecurity: No Food Insecurity (12/07/2022)   Hunger Vital Sign    Worried About Running Out of Food in the Last Year: Never true    Ran Out of Food in the Last Year: Never true  Transportation Needs: No Transportation Needs (12/07/2022)   PRAPARE - Administrator, Civil Service (Medical): No    Lack of Transportation (Non-Medical): No  Physical Activity: Not on file  Stress: Not on file  Social Connections: Not on file  Intimate Partner Violence: Not At Risk (12/07/2022)   Humiliation, Afraid, Rape, and Kick questionnaire    Fear of Current or Ex-Partner: No    Emotionally Abused: No    Physically Abused: No    Sexually Abused: No     REVIEW OF SYSTEMS:   [X]  denotes positive finding, [ ]  denotes negative finding Cardiac  Comments:  Chest pain or chest pressure:    Shortness of breath upon exertion:    Short of breath when lying flat:    Irregular heart rhythm:        Vascular    Pain in calf, thigh, or hip brought on by ambulation:    Pain in feet at night that wakes you up from your sleep:     Blood clot in your veins:    Leg swelling:         Pulmonary    Oxygen at home:    Productive cough:     Wheezing:         Neurologic    Sudden weakness in arms or legs:     Sudden numbness in arms or legs:     Sudden onset of difficulty speaking or slurred speech:    Temporary loss of vision in one eye:     Problems with dizziness:         Gastrointestinal    Blood in stool:     Vomited blood:         Genitourinary    Burning when urinating:     Blood in urine:  Psychiatric    Major depression:         Hematologic    Bleeding  problems:    Problems with blood clotting too easily:        Skin    Rashes or ulcers:        Constitutional    Fever or chills:      PHYSICAL EXAMINATION:  There were no vitals filed for this visit.  There is no height or weight on file to calculate BMI.   General:  WDWN in NAD; vital signs documented above Gait: Not observed HENT: WNL, normocephalic Pulmonary: normal non-labored breathing Cardiac: regular HR, with carotid bruit on the left Abdomen: soft, NT; aortic pulse is not palpable Skin: without rashes Vascular Exam/Pulses: Palpable bilateral radial and AT pulses Extremities: without open wounds Musculoskeletal: no muscle wasting or atrophy  Neurologic: A&O X 3; moving all extremities equally; speech is fluent/normal Psychiatric:  The pt has Normal affect.   Non-Invasive Vascular Imaging:   Carotid Duplex on 05/24/2022 Right:  1-39% ICA stenosis Left:  1-39% ICA stenosis      ASSESSMENT/PLAN:: 89 y.o. male here for follow up carotid artery stenosis and hx of left CEA for asymptomatic carotid artery stenosis on 09/23/2019 by Dr. Shirley Douglas.    Overall, I think he is doing well.  Imaging demonstrates no concerns for recurrent stenosis.  The right side has mild stenosis, which we will continue to follow on a yearly basis.  I am unsure as to the etiology of his waxing and waning fatigue.  He does have a pacemaker.  I do not have the capability to interrogate this.  I also looked at his medication regimen as it could be due to hypotension.  It appears he does have 1 antihypertensive agents, but he states that this was stopped months ago.  No concern for TIA, stroke, amaurosis at this time.  No concern for perfusion deficit as a causative factor for his symptoms. I recommended that he keep a journal with blood pressures when these events occur.  My plan is to continue seeing [place] on a yearly basis.   [facility] [phone]

## 2023-07-11 ENCOUNTER — Encounter: Payer: Self-pay | Admitting: Vascular Surgery

## 2023-07-11 ENCOUNTER — Ambulatory Visit (HOSPITAL_COMMUNITY)
Admission: RE | Admit: 2023-07-11 | Discharge: 2023-07-11 | Disposition: A | Discharge status: Home / Self Care | Source: Ambulatory Visit | Attending: Vascular Surgery | Admitting: Vascular Surgery

## 2023-07-11 ENCOUNTER — Ambulatory Visit (INDEPENDENT_AMBULATORY_CARE_PROVIDER_SITE_OTHER): Admitting: Vascular Surgery

## 2023-07-11 VITALS — BP 171/81 | HR 65 | Temp 98.3°F | Ht 73.0 in | Wt 181.0 lb

## 2023-07-11 DIAGNOSIS — I6529 Occlusion and stenosis of unspecified carotid artery: Secondary | ICD-10-CM

## 2023-07-19 ENCOUNTER — Ambulatory Visit: Payer: Medicare Other | Attending: Physician Assistant | Admitting: Physician Assistant

## 2023-07-19 VITALS — BP 130/67 | HR 65 | Ht 73.0 in | Wt 185.0 lb

## 2023-07-19 DIAGNOSIS — Z950 Presence of cardiac pacemaker: Secondary | ICD-10-CM | POA: Insufficient documentation

## 2023-07-19 DIAGNOSIS — I10 Essential (primary) hypertension: Secondary | ICD-10-CM | POA: Insufficient documentation

## 2023-07-19 DIAGNOSIS — R531 Weakness: Secondary | ICD-10-CM | POA: Insufficient documentation

## 2023-07-19 NOTE — Progress Notes (Signed)
 Cardiology Office Note:  .   Date:  07/19/2023  ID:  Roger Benjamin, DOB 01/06/1932, MRN [id number] PCP: Windell Hasty, DO  [facility] Providers Cardiologist:  Kyra Phy, MD Electrophysiologist:  Boyce Byes, MD {  History of Present Illness: .   Roger Benjamin is a 89 y.o. male w/PMHx of  Stroke, HTN, HLD Aortic atherosclerosis, coronary ca++ by CT Carotid disease (s/p left CEA 2021), subclavian artery disease (follows with Dr. Christia Cowboy) CHB w/PPM   Today's visit is scheduled as his 90 day post implant visit ROS:   He is accompanied by his daughter Reports that on 07/01/23, the whole day he felt really weak, tired, very unusually Says that he would typically walk to the gym do some exercising but just did not have the strength to get there that day No CP, palpitations, no SOB, but weak, tired. Not dizzy, did not feel like he was going to faint Did not feel ill/symptoms of illness The next day felt a lot better but not back to baseline By the 7th was good again.  They saw Dr. Christia Cowboy since then and asked about TIA/stroke, sounds like he suspected more of a blood pressure event.  Since then on occassionally he feels a little less energetic, but nothing like that day He feels great today Has been to the gym, some days does better then others.  Device information Biotronik dual chamber PPM implanted 04/18/23   Studies Reviewed: Aaron Aas    EKG not done today  DEVICE interrogation done today and reviewed by myself Battery and lead measurements are good He is dependent No AFib One NSVT on 07/15/23, 5 seconds (Nothing on 07/01/23 particularly)  12/07/22: TTE 1. Left ventricular ejection fraction, by estimation, is >75%. The left  ventricle has hyperdynamic function. The left ventricle has no regional  wall motion abnormalities. Left ventricular diastolic parameters were  normal.   2. Right ventricular systolic function is normal. The right ventricular   size is normal.   3. The mitral valve is normal in structure. No evidence of mitral valve  regurgitation. No evidence of mitral stenosis.   4. The aortic valve is tricuspid. Aortic valve regurgitation is not  visualized. Aortic valve sclerosis is present, with no evidence of aortic  valve stenosis.   5. The inferior vena cava is normal in size with greater than 50%  respiratory variability, suggesting right atrial pressure of 3 mmHg.     Risk Assessment/Calculations:    Physical Exam:   VS:  There were no vitals taken for this visit.   Wt Readings from Last 3 Encounters:  07/11/23 181 lb (82.1 kg)  04/18/23 182 lb (82.6 kg)  04/11/23 186 lb (84.4 kg)    GEN: Well nourished, well developed in no acute distress NECK: No JVD; No carotid bruits CARDIAC: RRR, no murmurs, rubs, gallops RESPIRATORY:  CTA b/l without rales, wheezing or rhonchi  ABDOMEN: Soft, non-tender, non-distended EXTREMITIES: No edema; No deformity   PPM site: is stable, no thinning, fluctuation, tethering  ASSESSMENT AND PLAN: .    PPM intact function no programming changes made  I dont think there are programming options for his symptom, given was transient with subsequent visits to the gym, days feeling well again Unclear etiology of is day he felt so poorly  HTN Looks great today Advised to monitor with an eye towards any  days with low readings Check BP when feeling weak/tired C/w Dr. Lorie Rook  Vascular disease C/w Dr.  Thukkani/VVS teams   Dispo: will have him back in 6-8 weeks given his symptoms, sooner if needed  Signed, Debbie Fails, PA-C

## 2023-07-19 NOTE — Patient Instructions (Signed)
 Medication Instructions:   Your physician recommends that you continue on your current medications as directed. Please refer to the Current Medication list given to you today.   *If you need a refill on your cardiac medications before your next appointment, please call your pharmacy*    Lab Work: NONE ORDERED  TODAY     If you have labs (blood work) drawn today and your tests are completely normal, you will receive your results only by: MyChart Message (if you have MyChart) OR A paper copy in the mail If you have any lab test that is abnormal or we need to change your treatment, we will call you to review the results.   Testing/Procedures: NONE ORDERED  TODAY    Follow-Up: At [facility], you and your health needs are our priority.  As part of our continuing mission to provide you with exceptional heart care, our providers are all part of one team.  This team includes your primary Cardiologist (physician) and Advanced Practice Providers or APPs (Physician Assistants and Nurse Practitioners) who all work together to provide you with the care you need, when you need it.  Your next appointment:     6 -8 week(s) ( CONTACT  CASSIE HALL/ ANGELINE HAMMER FOR EP SCHEDULING ISSUES )    Provider:    You may see Boyce Byes, MD or one of the following Advanced Practice Providers on your designated Care Team:   Mertha Abrahams, [place]  We recommend signing up for the patient portal called "MyChart".  Sign up information is provided on this After Visit Summary.  MyChart is used to connect with patients for Virtual Visits (Telemedicine).  Patients are able to view lab/test results, encounter notes, upcoming appointments, etc.  Non-urgent messages can be sent to your provider as well.   To learn more about what you can do with MyChart, go to [email or web address].    Other Instructions

## 2023-07-23 ENCOUNTER — Telehealth: Payer: Self-pay | Admitting: *Deleted

## 2023-07-23 DIAGNOSIS — R002 Palpitations: Secondary | ICD-10-CM

## 2023-07-23 MED ORDER — METOPROLOL SUCCINATE ER 25 MG PO TB24: 25.0000 mg | ORAL_TABLET | Freq: Every day | ORAL | 1 refills | Status: AC

## 2023-07-23 NOTE — Telephone Encounter (Signed)
 Called patient back and notified him of new Metoprolol  prescription and new blood work requested by MD.  Patient agreeable to these updates  Preferred pharmacy chosen to send medication to  Orders placed, released, and all questions answered

## 2023-07-23 NOTE — Telephone Encounter (Signed)
 Alert received from Biotronik:  Number of high ventricular rate episodes above limit (at least one) 1 high ventricular rate episode(s) detected since Jul 22, 2023, 1:52:02 AM - 1 episode(s) detected since previous message  13 second, 31 beat run with mean vent rate of 168 bpm  No rate control meds noted in EPIC  Patient likely sleeping  Routing to MD for awareness and review because > 20 beat run

## 2023-07-23 NOTE — Addendum Note (Signed)
 Addended by: Mariella Shore on: 07/23/2023 01:46 PM   Modules accepted: Orders

## 2023-07-23 NOTE — Telephone Encounter (Signed)
 Spoke with patient who stated that he felt "just fine" and that he was not experiencing any symptoms such as SOB, dizziness, chest pain, or palpitations.   Patient's device noted to have "Quick Check" function.   Requested remote transmission from patient's device  Transmission received and reviewed  Patient no longer in fast rhythm

## 2023-07-24 NOTE — Telephone Encounter (Signed)
 Spoke with patient - he has been scheduled to see Michaelle Adolphus, PA on 6/3. His 7/10 appt has been cancelled.

## 2023-07-25 ENCOUNTER — Ambulatory Visit: Payer: Self-pay | Admitting: Physician Assistant

## 2023-07-25 LAB — MAGNESIUM: Magnesium: 2.1 mg/dL (ref 1.6–2.3)

## 2023-07-25 LAB — BASIC METABOLIC PANEL WITH GFR
BUN/Creatinine Ratio: 19 (ref 10–24)
BUN: 14 mg/dL (ref 8–27)
CO2: 21 mmol/L (ref 20–29)
Calcium: 9 mg/dL (ref 8.6–10.2)
Chloride: 96 mmol/L (ref 96–106)
Creatinine, Ser: 0.75 mg/dL — ABNORMAL LOW (ref 0.76–1.27)
Glucose: 84 mg/dL (ref 70–99)
Potassium: 4.5 mmol/L (ref 3.5–5.2)
Sodium: 132 mmol/L — ABNORMAL LOW (ref 134–144)
eGFR: 86 mL/min/{1.73_m2} (ref >59)

## 2023-07-30 ENCOUNTER — Ambulatory Visit: Attending: Student | Admitting: Student

## 2023-07-30 ENCOUNTER — Encounter: Payer: Self-pay | Admitting: Student

## 2023-07-30 VITALS — BP 126/76 | HR 79 | Ht 73.0 in | Wt 182.0 lb

## 2023-07-30 DIAGNOSIS — I6529 Occlusion and stenosis of unspecified carotid artery: Secondary | ICD-10-CM | POA: Diagnosis present

## 2023-07-30 DIAGNOSIS — R531 Weakness: Secondary | ICD-10-CM | POA: Insufficient documentation

## 2023-07-30 DIAGNOSIS — I10 Essential (primary) hypertension: Secondary | ICD-10-CM | POA: Diagnosis present

## 2023-07-30 DIAGNOSIS — Z950 Presence of cardiac pacemaker: Secondary | ICD-10-CM | POA: Insufficient documentation

## 2023-07-30 NOTE — Progress Notes (Signed)
  Electrophysiology Office Note:   ID:  MATEJ SAPPENFIELD, DOB Nov 19, 1931, MRN [id number]  Primary Cardiologist: Kyra Phy, MD Electrophysiologist: Boyce Byes, MD      History of Present Illness:   Roger Benjamin is a 89 y.o. male with h/o CHB s/p PPM, carotid disease, subclavian artery disease, CVA, HTN, HLD, and Aortic atherosclerosis seen today for routine electrophysiology followup.   Since last being seen in our clinic the patient reports doing very well. No further episodes of weakness or fatigue. States he has felt better and better, and this past week was one of the "best he's had in a while".  No symptoms from starting metoprolol . Was worried regarding phone calls of NSVT that something serious was going on. Otherwise, he denies chest pain, palpitations, dyspnea, PND, orthopnea, nausea, vomiting, dizziness, syncope, edema, weight gain, or early satiety.   Review of systems complete and found to be negative unless listed in HPI.   EP Information / Studies Reviewed:    EKG is not ordered today. EKG from 04/18/2023 reviewed which showed AS-VP at 65 bpm       PPM Interrogation-  Prior remote and alert reviewed today.   Arrhythmia/Device History Biotronik dual chamber PPM implanted 04/18/23   Physical Exam:   VS:  BP 126/76   Pulse 79   Ht 6\' 1"  (1.854 m)   Wt 182 lb (82.6 kg)   SpO2 96%   BMI 24.01 kg/m    Wt Readings from Last 3 Encounters:  07/30/23 182 lb (82.6 kg)  07/19/23 185 lb (83.9 kg)  07/11/23 181 lb (82.1 kg)     GEN: No acute distress  NECK: No JVD; No carotid bruits CARDIAC: Regular rate and rhythm, no murmurs, rubs, gallops RESPIRATORY:  Clear to auscultation without rales, wheezing or rhonchi  ABDOMEN: Soft, non-tender, non-distended EXTREMITIES:  No edema; No deformity   ASSESSMENT AND PLAN:    Second Degree AV block s/p Biotronik PPM  Normal PPM function by check 07/19/23. In office check and remote/Alert for NSVT reviewed.     NSVT Echo 11/2022 LVEF >75% Continue toprol  25 mg daily.  Labs stable 5/28.    No chest pain and with normal recent EF would not likely pursue ischemic work up unless he has further, but will defer to Gen cards.   HTN Stable on current regimen   Vascular Disease Follows with Dr. Lorie Rook and VVS Carotid Duplex 06/2023 with 1-39% ICA stenosis bilaterally. >50% right ECA stenosis.   Disposition:   Follow up with EP APP in 12 months  Signed, Tylene Galla, PA-C

## 2023-07-30 NOTE — Patient Instructions (Signed)
 Medication Instructions:  No medication changes today. *If you need a refill on your cardiac medications before your next appointment, please call your pharmacy*  Lab Work: No labwork ordered today. If you have labs (blood work) drawn today and your tests are completely normal, you will receive your results only by: MyChart Message (if you have MyChart) OR A paper copy in the mail If you have any lab test that is abnormal or we need to change your treatment, we will call you to review the results.  Testing/Procedures: No testing ordered today  Follow-Up: At [facility], you and your health needs are our priority.  As part of our continuing mission to provide you with exceptional heart care, our providers are all part of one team.  This team includes your primary Cardiologist (physician) and Advanced Practice Providers or APPs (Physician Assistants and Nurse Practitioners) who all work together to provide you with the care you need, when you need it.  Your next appointment:   12 month(s)  Provider:   You may see Boyce Byes, MD or one of the following Advanced Practice Providers on your designated Care Team:   Mertha Abrahams, [place] Bambi Lever "Jonelle Neri" Willard, PA-C Suzann Riddle, NP Creighton Doffing, NP    We recommend signing up for the patient portal called "MyChart".  Sign up information is provided on this After Visit Summary.  MyChart is used to connect with patients for Virtual Visits (Telemedicine).  Patients are able to view lab/test results, encounter notes, upcoming appointments, etc.  Non-urgent messages can be sent to your provider as well.   To learn more about what you can do with MyChart, go to [email or web address].

## 2023-08-29 ENCOUNTER — Ambulatory Visit (INDEPENDENT_AMBULATORY_CARE_PROVIDER_SITE_OTHER): Payer: Medicare Other

## 2023-08-29 DIAGNOSIS — I639 Cerebral infarction, unspecified: Secondary | ICD-10-CM

## 2023-09-01 ENCOUNTER — Telehealth: Payer: Self-pay

## 2023-09-01 NOTE — Telephone Encounter (Signed)
 Biotronik alert received for 1 NSVT w/ 30 beats in duration.  Patient has had NSVT prior although <20 beats in duration. Echo 11/2022 LVEF >75%. Toprol  XL 25 mg daily on MAR. Currently UTD on in-clinic apt.  Will call patient during normal business hours to assess on symptoms.

## 2023-09-02 LAB — CUP PACEART REMOTE DEVICE CHECK
Date Time Interrogation Session: 20250703115209
Implantable Lead Connection Status: 753985
Implantable Lead Connection Status: 753985
Implantable Lead Implant Date: 20250220
Implantable Lead Implant Date: 20250220
Implantable Lead Location: 753859
Implantable Lead Location: 753860
Implantable Lead Model: 377
Implantable Lead Model: 377
Implantable Lead Serial Number: 8001757323
Implantable Lead Serial Number: 8001819466
Implantable Pulse Generator Implant Date: 20250220
Pulse Gen Serial Number: 1000449892

## 2023-09-02 NOTE — Telephone Encounter (Signed)
 Patient called and reports no symptoms and compliant with medications including no misses doses. Pt aware to call in future if any symptoms arise.   Routing to Dr. Cindie per clinic protocol.

## 2023-09-05 ENCOUNTER — Ambulatory Visit: Admitting: Physician Assistant

## 2023-09-07 ENCOUNTER — Ambulatory Visit: Payer: Self-pay | Admitting: Cardiology

## 2023-11-28 ENCOUNTER — Ambulatory Visit: Payer: Medicare Other

## 2023-11-28 DIAGNOSIS — I639 Cerebral infarction, unspecified: Secondary | ICD-10-CM | POA: Diagnosis not present

## 2023-11-29 LAB — CUP PACEART REMOTE DEVICE CHECK
Battery Voltage: 95
Date Time Interrogation Session: 20251002100833
Implantable Lead Connection Status: 753985
Implantable Lead Connection Status: 753985
Implantable Lead Implant Date: 20250220
Implantable Lead Implant Date: 20250220
Implantable Lead Location: 753859
Implantable Lead Location: 753860
Implantable Lead Model: 377
Implantable Lead Model: 377
Implantable Lead Serial Number: 8001757323
Implantable Lead Serial Number: 8001819466
Implantable Pulse Generator Implant Date: 20250220
Pulse Gen Serial Number: 1000449892

## 2023-12-01 ENCOUNTER — Ambulatory Visit: Payer: Self-pay | Admitting: Cardiology

## 2023-12-02 NOTE — Progress Notes (Signed)
 Remote PPM Transmission

## 2023-12-05 NOTE — Progress Notes (Signed)
 Remote PPM Transmission

## 2024-02-28 ENCOUNTER — Ambulatory Visit (INDEPENDENT_AMBULATORY_CARE_PROVIDER_SITE_OTHER): Payer: Medicare Other

## 2024-02-28 DIAGNOSIS — I639 Cerebral infarction, unspecified: Secondary | ICD-10-CM

## 2024-03-01 LAB — CUP PACEART REMOTE DEVICE CHECK
Date Time Interrogation Session: 20260102095146
Implantable Lead Connection Status: 753985
Implantable Lead Connection Status: 753985
Implantable Lead Implant Date: 20250220
Implantable Lead Implant Date: 20250220
Implantable Lead Location: 753859
Implantable Lead Location: 753860
Implantable Lead Model: 377
Implantable Lead Model: 377
Implantable Lead Serial Number: 8001757323
Implantable Lead Serial Number: 8001819466
Implantable Pulse Generator Implant Date: 20250220
Pulse Gen Serial Number: 1000449892

## 2024-03-04 ENCOUNTER — Other Ambulatory Visit: Payer: Self-pay | Admitting: Urology

## 2024-03-04 NOTE — Progress Notes (Signed)
 Remote PPM Transmission

## 2024-03-05 ENCOUNTER — Telehealth: Payer: Self-pay | Admitting: Student

## 2024-03-05 NOTE — Telephone Encounter (Signed)
" ° °  Pre-operative Risk Assessment    Patient Name: Roger Benjamin  DOB: March 31, 1931 MRN: [id number]      Request for Surgical Clearance    Procedure:  Resection of Prostate   Date of Surgery:  Clearance 04/02/24                                 Surgeon: Dr. Steffan Pea  Surgeon's Group or Practice Name: [facility]  Phone number: [phone] Fax number: [phone]   Type of Clearance Requested:   - Medical  - Pharmacy:  Hold Clopidogrel  (Plavix )     Type of Anesthesia:  General    Additional requests/questions:  Please advise surgeon/provider what medications should be held.  Roger Benjamin   03/05/2024, 4:20 PM   "

## 2024-03-06 ENCOUNTER — Telehealth: Payer: Self-pay

## 2024-03-06 NOTE — Telephone Encounter (Signed)
 Called patient to schedule a televisit appointment for a pre-op  clearance on 03/13/24 @ 10:00. Meds, Rec, and Consent done.        Patient Consent for Virtual Visit        Roger Benjamin has provided verbal consent on 03/06/2024 for a virtual visit (video or telephone).   CONSENT FOR VIRTUAL VISIT FOR:  Roger Benjamin  By participating in this virtual visit I agree to the following:  I hereby voluntarily request, consent and authorize [facility] and its employed or contracted physicians, physician assistants, nurse practitioners or other licensed health care professionals (the Practitioner), to provide me with telemedicine health care services (the Services) as deemed necessary by the treating Practitioner. I acknowledge and consent to receive the Services by the Practitioner via telemedicine. I understand that the telemedicine visit will involve communicating with the Practitioner through live audiovisual communication technology and the disclosure of certain medical information by electronic transmission. I acknowledge that I have been given the opportunity to request an in-person assessment or other available alternative prior to the telemedicine visit and am voluntarily participating in the telemedicine visit.  I understand that I have the right to withhold or withdraw my consent to the use of telemedicine in the course of my care at any time, without affecting my right to future care or treatment, and that the Practitioner or I may terminate the telemedicine visit at any time. I understand that I have the right to inspect all information obtained and/or recorded in the course of the telemedicine visit and may receive copies of available information for a reasonable fee.  I understand that some of the potential risks of receiving the Services via telemedicine include:  Delay or interruption in medical evaluation due to technological equipment failure or disruption; Information  transmitted may not be sufficient (e.g. poor resolution of images) to allow for appropriate medical decision making by the Practitioner; and/or  In rare instances, security protocols could fail, causing a breach of personal health information.  Furthermore, I acknowledge that it is my responsibility to provide information about my medical history, conditions and care that is complete and accurate to the best of my ability. I acknowledge that Practitioner's advice, recommendations, and/or decision may be based on factors not within their control, such as incomplete or inaccurate data provided by me or distortions of diagnostic images or specimens that may result from electronic transmissions. I understand that the practice of medicine is not an exact science and that Practitioner makes no warranties or guarantees regarding treatment outcomes. I acknowledge that a copy of this consent can be made available to me via my patient portal [facility] MyChart), or I can request a printed copy by calling the office of [facility].    I understand that my insurance will be billed for this visit.   I have read or had this consent read to me. I understand the contents of this consent, which adequately explains the benefits and risks of the Services being provided via telemedicine.  I have been provided ample opportunity to ask questions regarding this consent and the Services and have had my questions answered to my satisfaction. I give my informed consent for the services to be provided through the use of telemedicine in my medical care

## 2024-03-06 NOTE — Telephone Encounter (Signed)
" ° °  Name: Roger Benjamin  DOB: Jul 14, 1931  MRN: [id number]  Primary Cardiologist: Arun K Thukkani, MD   Preoperative team, please contact this patient and set up a phone call appointment for further preoperative risk assessment. Please obtain consent and complete medication review. Thank you for your help.  I confirm that guidance regarding antiplatelet and oral anticoagulation therapy has been completed and, if necessary, noted below.  Patient takes Plavix  for history of CVA.  This is not managed by cardiology.  Recommendations for holding Plavix  prior to procedure should come from managing provider (PCP).  I also confirmed the patient resides in the state of [place] . As per [facility] [employer] [employer] telemedicine laws, the patient must reside in the state in which the provider is licensed.   Damien JAYSON Braver, NP 03/06/2024, 8:49 AM [facility]    "

## 2024-03-06 NOTE — Telephone Encounter (Signed)
 Called patient to schedule a televisit appointment for a pre-op  clearance on 03/13/24 @ 10:00. Meds, Rec, and Consent done.

## 2024-03-07 ENCOUNTER — Ambulatory Visit: Payer: Self-pay | Admitting: Cardiovascular Disease

## 2024-03-10 ENCOUNTER — Other Ambulatory Visit: Payer: Self-pay | Admitting: Internal Medicine

## 2024-03-10 DIAGNOSIS — R531 Weakness: Secondary | ICD-10-CM

## 2024-03-11 ENCOUNTER — Other Ambulatory Visit (HOSPITAL_COMMUNITY): Payer: Self-pay | Admitting: Internal Medicine

## 2024-03-11 DIAGNOSIS — R531 Weakness: Secondary | ICD-10-CM

## 2024-03-12 ENCOUNTER — Ambulatory Visit: Admitting: Podiatry

## 2024-03-12 ENCOUNTER — Encounter: Payer: Self-pay | Admitting: Podiatry

## 2024-03-12 ENCOUNTER — Ambulatory Visit

## 2024-03-12 DIAGNOSIS — D699 Hemorrhagic condition, unspecified: Secondary | ICD-10-CM

## 2024-03-12 DIAGNOSIS — M7752 Other enthesopathy of left foot: Secondary | ICD-10-CM | POA: Diagnosis not present

## 2024-03-12 DIAGNOSIS — M2042 Other hammer toe(s) (acquired), left foot: Secondary | ICD-10-CM

## 2024-03-12 DIAGNOSIS — L84 Corns and callosities: Secondary | ICD-10-CM

## 2024-03-12 DIAGNOSIS — B351 Tinea unguium: Secondary | ICD-10-CM

## 2024-03-12 NOTE — Progress Notes (Signed)
 Subjective:   Patient ID: Roger Benjamin, male   DOB: 89 y.o.   MRN: [id number]   HPI Patient presents stating that he had a lot of problems with the second toe on his left with pain he is on a blood thinner and also has nail disease 1-5 both feet.  Patient no longer smokes is trying to be active and presents with caregiver   Review of Systems  All other systems reviewed and are negative.       Objective:  Physical Exam Vitals and nursing note reviewed.  Constitutional:      Appearance: He is well-developed.  Pulmonary:     Effort: Pulmonary effort is normal.  Musculoskeletal:        General: Normal range of motion.  Skin:    General: Skin is warm.  Neurological:     Mental Status: He is alert.     Neurovascular status is mildly diminished but intact with diminishment of PT DP pulses and diminishment of sharp dull vibratory.  There is significant structural bunion deformity pressing on the second digit left with distal keratotic lesion that is painful when pressed with structural damage to the toe itself.  Patient has nail disease 1-5 both feet with thick nails that are painful and he cannot cut and has a significant lesion left.  Patient does have good digital perfusion well-oriented x 3     Assessment:  Hammertoe deformity of digit 2 left with distal keratotic lesion painful when pressed along with patient on high risk blood thinner and has mycotic nail infection 1-5 both feet along with structural imbalance     Plan:  H&P x-ray left reviewed.  At this time sterile debridement of the lesion distal second toe accomplished and I debrided nailbeds 1-5 both feet no iatrogenic bleeding.  I applied 2 different types of offloading devices 1 being a digital cushion and secondarily a crest pad to try to offload and advised on him looking for different shoes that might be better for him.  Patient will be seen back as symptoms indicate and I do hope we can avoid surgery on this  patient  X-rays indicate there is significant structural bunion pressure on the second digit left distal

## 2024-03-13 ENCOUNTER — Ambulatory Visit: Attending: Cardiology | Admitting: Emergency Medicine

## 2024-03-13 DIAGNOSIS — Z01810 Encounter for preprocedural cardiovascular examination: Secondary | ICD-10-CM

## 2024-03-13 NOTE — Progress Notes (Signed)
 "   Virtual Visit via Telephone Note   Because of Roger Benjamin co-morbid illnesses, he is at least at moderate risk for complications without adequate follow up.  This format is felt to be most appropriate for this patient at this time.  Due to technical limitations with video connection (technology), today's appointment will be conducted as an audio only telehealth visit, and Roger Benjamin verbally agreed to proceed in this manner.   All issues noted in this document were discussed and addressed.  No physical exam could be performed with this format.  Evaluation Performed:  Preoperative cardiovascular risk assessment _____________   Date:  03/13/2024   Patient ID:  Roger Benjamin, DOB Mar 18, 1931, MRN [id number] Patient Location:  Home Provider location:   Office  Primary Care Provider:  Valentin Skates, DO Primary Cardiologist:  Roger K Thukkani, MD  Chief Complaint / Patient Profile   89 y.o. y/o male with a h/o complete heart block s/p PPM, carotid artery disease followed by Dr. Silver, subclavian artery disease, CVA, coronary artery disease, hypertension, hyperlipidemia, aortic atherosclerosis who is pending resection of prostate on 04/02/2024 by [facility] and presents today for telephonic preoperative cardiovascular risk assessment.  History of Present Illness    Roger Benjamin is a 89 y.o. male who presents via audio/video conferencing for a telehealth visit today.  Pt was last seen in cardiology clinic on 07/30/2023 by Roger Passey, PA.  At that time Roger Benjamin was doing well.  The patient is now pending procedure as outlined above. Since his last visit, he is doing well without acute cardiovascular concerns.  Tells me he remains active and goes to the gym 3 days a week where he walks and does light weightlifting without any exertional symptoms.  He denies chest pains, dyspnea, syncope, orthopnea, dizziness.  He is without symptoms of angina.  He is able to complete  greater than 4 METS.  Past Medical History    Past Medical History:  Diagnosis Date   Atrioventricular block    1st degree   Carotid artery stenosis    Colon polyposis    Emphysema lung (HCC)    incidental finding on CT scan 01/2021   HLD (hyperlipidemia)    Hypertension    Unsure is on Metoprolol    Psoriasis    Skin cancer    Past Surgical History:  Procedure Laterality Date   ENDARTERECTOMY Left 09/23/2019   Procedure: ENDARTERECTOMY LEFT CAROTID with PATCH ANGIOPLASTY;  Surgeon: Roger Krystal FALCON, MD;  Location: [facility] OR;  Service: Vascular;  Laterality: Left;   FRACTURE SURGERY Left    leg   PACEMAKER IMPLANT N/A 04/18/2023   Procedure: PACEMAKER IMPLANT;  Surgeon: Roger Ole DASEN, MD;  Location: [facility] INVASIVE CV LAB;  Service: Cardiovascular;  Laterality: N/A;   SKIN CANCER EXCISION     TONSILLECTOMY     as a child    Allergies  Allergies[1]  Home Medications    Prior to Admission medications  Medication Sig Start Date End Date Taking? Authorizing Provider  b complex vitamins capsule Take 1 capsule by mouth daily.    [provider]  Cholecalciferol (VITAMIN D-3 PO) Take 1 capsule by mouth daily at 6 (six) AM.    [provider]  clopidogrel  (PLAVIX ) 75 MG tablet Take 75 mg by mouth daily. 12/11/22   [provider]  Cyanocobalamin (VITAMIN B 12 PO) Take 1 tablet by mouth daily.    [provider]  losartan  (COZAAR ) 25  MG tablet Take 0.5 tablets (12.5 mg total) by mouth at bedtime. 10/17/22   Benjamin, Roger K, MD  metoprolol  succinate (TOPROL  XL) 25 MG 24 hr tablet Take 1 tablet (25 mg total) by mouth daily. 07/23/23   Roger Charlies Helling, PA-C  Multiple Vitamins-Minerals (MULTIVITAMIN WITH MINERALS) tablet Take 1 tablet by mouth daily.    [provider]  rosuvastatin  (CRESTOR ) 40 MG tablet Take 1 tablet (40 mg total) by mouth daily. 04/26/23 03/12/24  Benjamin, Roger K, MD  tamsulosin (FLOMAX) 0.4 MG CAPS capsule Take 0.4 mg by mouth  daily. 10/09/22   [provider]  triamcinolone (KENALOG) 0.025 % cream Apply 1 Application topically daily as needed (irritation). 12/04/22   [provider]  Vibegron (GEMTESA) 75 MG TABS Take by mouth.    [provider]    Physical Exam    Vital Signs:  Roger Benjamin does not have vital signs available for review today.  Given telephonic nature of communication, physical exam is limited. AAOx3. NAD. Normal affect.  Speech and respirations are unlabored.  Accessory Clinical Findings    None  Assessment & Plan    1.  Preoperative Cardiovascular Risk Assessment: According to the Revised Cardiac Risk Index (RCRI), his Perioperative Risk of Major Cardiac Event is (%): 0.9. His Functional Capacity in METs is: 5.07 according to the [facility] Activity Status Index (DASI).  Therefore, based on ACC/AHA guidelines, patient would be at acceptable risk for the planned procedure without further cardiovascular testing. I will route this recommendation to the requesting party via Epic fax function.  The patient was advised that if he develops new symptoms prior to surgery to contact our office to arrange for a follow-up visit, and he verbalized understanding.  Patient takes Plavix  for history of CVA. This is not managed by cardiology. Recommendations for holding Plavix  prior to procedure should come from managing provider (PCP).   A copy of this note will be routed to requesting surgeon.  Time:   Today, I have spent 8 minutes with the patient with telehealth technology discussing medical history, symptoms, and management plan.     Roger LITTIE Louis, NP  03/13/2024, 10:04 AM     [1]  Allergies Allergen Reactions   Silodosin     Other Reaction(s): syncope   Sulfa  Antibiotics Other (See Comments)    stomach upset   Tape Other (See Comments)    Needs paper tape   "

## 2024-03-18 ENCOUNTER — Other Ambulatory Visit: Payer: Self-pay

## 2024-03-18 DIAGNOSIS — I6529 Occlusion and stenosis of unspecified carotid artery: Secondary | ICD-10-CM

## 2024-03-19 ENCOUNTER — Ambulatory Visit (HOSPITAL_COMMUNITY)
Admission: RE | Admit: 2024-03-19 | Discharge: 2024-03-19 | Disposition: A | Discharge status: Home / Self Care | Source: Ambulatory Visit | Attending: Surgery | Admitting: Surgery

## 2024-03-19 ENCOUNTER — Other Ambulatory Visit (HOSPITAL_COMMUNITY): Payer: Self-pay | Admitting: Internal Medicine

## 2024-03-19 DIAGNOSIS — R531 Weakness: Secondary | ICD-10-CM

## 2024-03-19 DIAGNOSIS — I6529 Occlusion and stenosis of unspecified carotid artery: Secondary | ICD-10-CM | POA: Diagnosis not present

## 2024-03-23 NOTE — Patient Instructions (Signed)
 SURGICAL WAITING ROOM VISITATION  Patients having surgery or a procedure may have no more than 2 support people in the waiting area - these visitors may rotate.    Children ages 75 and under will not be able to visit patients in [facility] under most circumstances.   Visitors with respiratory illnesses are discouraged from visiting and should remain at home.  If the patient needs to stay at the hospital during part of their recovery, the visitor guidelines for inpatient rooms apply. Pre-op  nurse will coordinate an appropriate time for 1 support person to accompany patient in pre-op .  This support person may not rotate.    Please refer to the [facility] website for the visitor guidelines for Inpatients (after your surgery is over and you are in a regular room).       Your procedure is scheduled on: 04/02/24   Report to [facility] Main Entrance    Report to admitting at 10:45 AM   Call this number if you have problems the morning of surgery [phone]   Do not eat food or drink liquids  :After Midnight.but may have sips of water to take meds.    FOLLOW BOWEL PREP AND ANY ADDITIONAL PRE OP INSTRUCTIONS YOU RECEIVED FROM YOUR SURGEON'S OFFICE!!!     Oral Hygiene is also important to reduce your risk of infection.                                    Remember - BRUSH YOUR TEETH THE MORNING OF SURGERY WITH YOUR REGULAR TOOTHPASTE  DENTURES WILL BE REMOVED PRIOR TO SURGERY PLEASE DO NOT APPLY Poly grip OR ADHESIVES!!!     Stop all vitamins and herbal supplements 7 days before surgery.   Take these medicines the morning of surgery with A SIP OF WATER: Metoprolol (toprol ), rosuvastatin (crestor )             You may not have any metal on your body including hair pins, jewelry, and body piercing             Do not wear make-up, lotions, powders, perfumes/cologne, or deodorant              Men may shave face and neck.   Do not bring valuables to the hospital. [facility] IS NOT             RESPONSIBLE   FOR VALUABLES.   Contacts, glasses, dentures or bridgework may not be worn into surgery.   Bring small overnight bag day of surgery.   DO NOT BRING YOUR HOME MEDICATIONS TO THE HOSPITAL. PHARMACY WILL DISPENSE MEDICATIONS LISTED ON YOUR MEDICATION LIST TO YOU DURING YOUR ADMISSION IN THE HOSPITAL!    Patients discharged on the day of surgery will not be allowed to drive home.  Someone NEEDS to stay with you for the first 24 hours after anesthesia.   Special Instructions: Bring a copy of your healthcare power of attorney and living will documents the day of surgery if you haven't scanned them before.              Please read over the following fact sheets you were given: IF YOU HAVE QUESTIONS ABOUT YOUR PRE-OP  INSTRUCTIONS PLEASE CALL [phone] Roger Benjamin   If you received a COVID test during your pre-op  visit  it is requested that you wear a mask when out in public, stay away from anyone that  may not be feeling well and notify your surgeon if you develop symptoms. If you test positive for Covid or have been in contact with anyone that has tested positive in the last 10 days please notify you surgeon.    [place] - Preparing for Surgery Before surgery, you can play an important role.  Because skin is not sterile, your skin needs to be as free of germs as possible.  You can reduce the number of germs on your skin by washing with CHG (chlorahexidine gluconate) soap before surgery.  CHG is an antiseptic cleaner which kills germs and bonds with the skin to continue killing germs even after washing. Please DO NOT use if you have an allergy to CHG or antibacterial soaps.  If your skin becomes reddened/irritated stop using the CHG and inform your nurse when you arrive at Short Stay. Do not shave (including legs and underarms) for at least 48 hours prior to the first CHG shower.  You may shave your face/neck.  Please follow these instructions carefully:  1.   Shower with CHG Soap the night before surgery and the morning of surgery.  2.  If you choose to wash your hair, wash your hair first as usual with your normal  shampoo.  3.  After you shampoo, rinse your hair and body thoroughly to remove the shampoo.                             4.  Use CHG as you would any other liquid soap.  You can apply chg directly to the skin and wash.  Gently with a scrungie or clean washcloth.  5.  Apply the CHG Soap to your body ONLY FROM THE NECK DOWN.   Do   not use on face/ open                           Wound or open sores. Avoid contact with eyes, ears mouth and   genitals (private parts).                       Wash face,  Genitals (private parts) with your normal soap.             6.  Wash thoroughly, paying special attention to the area where your    surgery  will be performed.  7.  Thoroughly rinse your body with warm water from the neck down.  8.  DO NOT shower/wash with your normal soap after using and rinsing off the CHG Soap.                9.  Pat yourself dry with a clean towel.            10.  Wear clean pajamas.            11.  Place clean sheets on your bed the night of your first shower and do not  sleep with pets. Day of Surgery : Do not apply any lotions/deodorants the morning of surgery.  Please wear clean clothes to the hospital/surgery center.  FAILURE TO FOLLOW THESE INSTRUCTIONS MAY RESULT IN THE CANCELLATION OF YOUR SURGERY  PATIENT SIGNATURE_________________________________  NURSE SIGNATURE__________________________________  ________________________________________________________________________

## 2024-03-23 NOTE — Progress Notes (Signed)
 COVID Vaccine received:  []  No [x]  Yes Date of any COVID positive Test in last 90 days: none PCP - Massie Sewer DO Cardiologist - Lurena Red MD Electrophys- Ole Holts MD  Chest x-ray - 04/18/23 Epic EKG -  04/18/23 Epic Stress Test -  ECHO - 12/07/22 Epic Cardiac Cath -   Cardiac clearance 03/13/24-Madison FountainNP  Bowel Prep - [x]  No  []   Yes ______  Pacemaker / ICD device []  No [x]  Yes   Spinal Cord Stimulator:[x]  No []  Yes       History of Sleep Apnea? [x]  No []  Yes   CPAP used?- [x]  No []  Yes    Does the patient monitor blood sugar?          [x]  No []  Yes  []  N/A  Patient has: [x]  NO Hx DM   []  Pre-DM                 []  DM1  []   DM2 Does patient have a [employer] or [employer]om? []  No []  Yes   Fasting Blood Sugar Ranges-  Checks Blood Sugar _____ times a day  GLP1 agonist / usual dose - no GLP1 instructions:  SGLT-2 inhibitors / usual dose -no SGLT-2 instructions:   Blood Thinner / Instructions:Plavix  Last dose will be Feb. 1, 2026 8 am Aspirin  Instructions:no  Comments:   Activity level: Patient is able to climb a flight of stairs without difficulty; [x]  No CP  [x]  No SOB,    Patient can  perform ADLs without assistance.   Anesthesia review: Hx. Stroke, emphysema, HTN, pacemaker- Device orders in notes. Pt. On Plavix , AV block  Patient denies shortness of breath, fever, cough and chest pain at PAT appointment.  Patient verbalized understanding and agreement to the Pre-Surgical Instructions that were given to them at this PAT appointment. Patient was also educated of the need to review these PAT instructions again prior to his/her surgery.I reviewed the appropriate phone numbers to call if they have any and questions or concerns.

## 2024-03-24 NOTE — Progress Notes (Unsigned)
 " HISTORY AND PHYSICAL    HPI: This is a 89 y.o. male here for follow up for carotid artery stenosis.  Pt is s/p left CEA for asymptomatic carotid artery stenosis on 09/23/2019 by Dr. Oris.    At his 10/12/2021 visit he noted some dizziness and had a CTA that revealed right SCA stenosis and right CCA and ICA with less than 50% stenosis.  The left ICA did not have any recurrent stenosis.  His dizziness had improved with hydration and he was not having any other neurological sx.   Last year, Roger Benjamin had a stroke with bilateral parietal artery infarcts.  The right carotid was not the culprit as the infarcts were bilateral.    On exam today, Pt returns today for follow up.   Pt denies any amaurosis fugax, speech difficulties, weakness, numbness, paralysis or clumsiness or facial droop.   Has had a few episodes of waxing and waning weakness that he describes as just being very tired with low energy.  He denies unilateral deficits, no speech changes, clumsiness, ataxia, presyncopal episodes. He describes the fatigue episodes lasting for roughly a day.  He stated one happened a few days ago in which he was very tired at physical therapy, but the next day he felt great, with more energy than ever.   The pt is on a statin for cholesterol management.  The pt is on a daily aspirin .   Other AC:  none The pt is on BB for hypertension.   The pt does not have diabetes Tobacco hx:  former   Past Medical History:  Diagnosis Date   Atrioventricular block    1st degree   Carotid artery stenosis    Colon polyposis    Emphysema lung (HCC)    incidental finding on CT scan 01/2021   HLD (hyperlipidemia)    Hypertension    Unsure is on Metoprolol    Psoriasis    Skin cancer     Past Surgical History:  Procedure Laterality Date   ENDARTERECTOMY Left 09/23/2019   Procedure: ENDARTERECTOMY LEFT CAROTID with PATCH ANGIOPLASTY;  Surgeon: Oris Krystal FALCON, MD;  Location: [facility] OR;  Service: Vascular;  Laterality:  Left;   FRACTURE SURGERY Left    leg   PACEMAKER IMPLANT N/A 04/18/2023   Procedure: PACEMAKER IMPLANT;  Surgeon: Cindie Ole DASEN, MD;  Location: [facility];  Service: Cardiovascular;  Laterality: N/A;   SKIN CANCER EXCISION     TONSILLECTOMY     as a child    Allergies  Allergen Reactions   Silodosin Other (See Comments)    syncope/dizziness   Sulfa  Antibiotics Other (See Comments)    stomach upset   Tape Other (See Comments)    Needs paper tape    Current Outpatient Medications  Medication Sig Dispense Refill   Cholecalciferol (VITAMIN D-3 PO) Take 1 capsule by mouth in the morning.     clopidogrel  (PLAVIX ) 75 MG tablet Take 75 mg by mouth in the morning.     metoprolol  succinate (TOPROL  XL) 25 MG 24 hr tablet Take 1 tablet (25 mg total) by mouth daily. 90 tablet 1   Polyethyl Glycol-Propyl Glycol (SYSTANE) 0.4-0.3 % SOLN Place 1-2 drops into both eyes 3 (three) times daily as needed (dry/irritated eyes.).     rosuvastatin  (CRESTOR ) 20 MG tablet Take 20 mg by mouth in the morning.     Vibegron (GEMTESA) 75 MG TABS Take 75 mg by mouth at bedtime.     No current  facility-administered medications for this visit.    Family History  Problem Relation Age of Onset   Stroke Neg Hx     Social History   Socioeconomic History   Marital status: Married    Spouse name: Not on file   Number of children: Not on file   Years of education: Not on file   Highest education level: Not on file  Occupational History   Not on file  Tobacco Use   Smoking status: Former    Current packs/day: 0.00    Average packs/day: 3.0 packs/day for 30.0 years (90.0 ttl pk-yrs)    Types: Cigarettes    Start date: 49    Quit date: 02/27/1980    Years since quitting: 44.1   Smokeless tobacco: Never  Vaping Use   Vaping status: Never Used  Substance and Sexual Activity   Alcohol use: Yes    Alcohol/week: 7.0 standard drinks of alcohol    Types: 7 Shots of liquor per week    Comment: 1  drink daily    Drug use: Never   Sexual activity: Not on file  Other Topics Concern   Not on file  Social History Narrative   Not on file   Social Drivers of Health   Tobacco Use: Medium Risk (03/13/2024)   Patient History    Smoking Tobacco Use: Former    Smokeless Tobacco Use: Never    Passive Exposure: Not on Actuary Strain: Not on file  Food Insecurity: No Food Insecurity (12/07/2022)   Hunger Vital Sign    Worried About Running Out of Food in the Last Year: Never true    Ran Out of Food in the Last Year: Never true  Transportation Needs: No Transportation Needs (12/07/2022)   PRAPARE - Administrator, Civil Service (Medical): No    Lack of Transportation (Non-Medical): No  Physical Activity: Not on file  Stress: Not on file  Social Connections: Not on file  Intimate Partner Violence: Not At Risk (12/07/2022)   Humiliation, Afraid, Rape, and Kick questionnaire    Fear of Current or Ex-Partner: No    Emotionally Abused: No    Physically Abused: No    Sexually Abused: No  Depression (PHQ2-9): Not on file  Alcohol Screen: Not on file  Housing: Patient Declined (12/07/2022)   Housing    Last Housing Risk Score: 0  Utilities: Not At Risk (12/07/2022)   AHC Utilities    Threatened with loss of utilities: No  Health Literacy: Not on file     REVIEW OF SYSTEMS:   [X]  denotes positive finding, [ ]  denotes negative finding Cardiac  Comments:  Chest pain or chest pressure:    Shortness of breath upon exertion:    Short of breath when lying flat:    Irregular heart rhythm:        Vascular    Pain in calf, thigh, or hip brought on by ambulation:    Pain in feet at night that wakes you up from your sleep:     Blood clot in your veins:    Leg swelling:         Pulmonary    Oxygen at home:    Productive cough:     Wheezing:         Neurologic    Sudden weakness in arms or legs:     Sudden numbness in arms or legs:     Sudden onset of  difficulty speaking or  slurred speech:    Temporary loss of vision in one eye:     Problems with dizziness:         Gastrointestinal    Blood in stool:     Vomited blood:         Genitourinary    Burning when urinating:     Blood in urine:        Psychiatric    Major depression:         Hematologic    Bleeding problems:    Problems with blood clotting too easily:        Skin    Rashes or ulcers:        Constitutional    Fever or chills:      PHYSICAL EXAMINATION:  There were no vitals filed for this visit.  There is no height or weight on file to calculate BMI.   General:  WDWN in NAD; vital signs documented above Gait: Not observed HENT: WNL, normocephalic Pulmonary: normal non-labored breathing Cardiac: regular HR, with carotid bruit on the left Abdomen: soft, NT; aortic pulse is not palpable Skin: without rashes Vascular Exam/Pulses: Palpable bilateral radial and AT pulses Extremities: without open wounds Musculoskeletal: no muscle wasting or atrophy  Neurologic: A&O X 3; moving all extremities equally; speech is fluent/normal Psychiatric:  The pt has Normal affect.   Non-Invasive Vascular Imaging:   Carotid Duplex on 05/24/2022 Right:  1-39% ICA stenosis Left:  1-39% ICA stenosis      ASSESSMENT/PLAN:: 89 y.o. male here for follow up carotid artery stenosis and hx of left CEA for asymptomatic carotid artery stenosis on 09/23/2019 by Dr. Oris.    Overall, I think he is doing well.  Imaging demonstrates no concerns for recurrent stenosis.  The right side has mild stenosis, which we will continue to follow on a yearly basis.  I am unsure as to the etiology of his waxing and waning fatigue.  He does have a pacemaker.  I do not have the capability to interrogate this.  I also looked at his medication regimen as it could be due to hypotension.  It appears he does have 1 antihypertensive agents, but he states that this was stopped months ago.  No concern for  TIA, stroke, amaurosis at this time.  No concern for perfusion deficit as a causative factor for his symptoms. I recommended that he keep a journal with blood pressures when these events occur.  My plan is to continue seeing Roger Benjamin on a yearly basis.   [facility] [phone]  "

## 2024-03-26 ENCOUNTER — Ambulatory Visit: Attending: Vascular Surgery | Admitting: Vascular Surgery

## 2024-03-26 ENCOUNTER — Encounter (HOSPITAL_COMMUNITY)
Admission: RE | Admit: 2024-03-26 | Discharge: 2024-03-26 | Disposition: A | Discharge status: Home / Self Care | Source: Ambulatory Visit | Attending: Urology | Admitting: Urology

## 2024-03-26 ENCOUNTER — Encounter (HOSPITAL_COMMUNITY): Payer: Self-pay

## 2024-03-26 ENCOUNTER — Other Ambulatory Visit: Payer: Self-pay

## 2024-03-26 ENCOUNTER — Encounter: Payer: Self-pay | Admitting: Vascular Surgery

## 2024-03-26 ENCOUNTER — Encounter: Payer: Self-pay | Admitting: Cardiovascular Disease

## 2024-03-26 VITALS — BP 129/70 | HR 66 | Temp 98.2°F | Resp 20 | Ht 73.0 in | Wt 178.3 lb

## 2024-03-26 DIAGNOSIS — N138 Other obstructive and reflux uropathy: Secondary | ICD-10-CM | POA: Insufficient documentation

## 2024-03-26 DIAGNOSIS — Z01818 Encounter for other preprocedural examination: Secondary | ICD-10-CM | POA: Diagnosis present

## 2024-03-26 DIAGNOSIS — G4733 Obstructive sleep apnea (adult) (pediatric): Secondary | ICD-10-CM | POA: Diagnosis not present

## 2024-03-26 DIAGNOSIS — Z950 Presence of cardiac pacemaker: Secondary | ICD-10-CM | POA: Insufficient documentation

## 2024-03-26 DIAGNOSIS — I2510 Atherosclerotic heart disease of native coronary artery without angina pectoris: Secondary | ICD-10-CM | POA: Insufficient documentation

## 2024-03-26 DIAGNOSIS — Z8673 Personal history of transient ischemic attack (TIA), and cerebral infarction without residual deficits: Secondary | ICD-10-CM | POA: Diagnosis not present

## 2024-03-26 DIAGNOSIS — I6529 Occlusion and stenosis of unspecified carotid artery: Secondary | ICD-10-CM | POA: Insufficient documentation

## 2024-03-26 DIAGNOSIS — J449 Chronic obstructive pulmonary disease, unspecified: Secondary | ICD-10-CM | POA: Diagnosis not present

## 2024-03-26 DIAGNOSIS — I442 Atrioventricular block, complete: Secondary | ICD-10-CM | POA: Insufficient documentation

## 2024-03-26 DIAGNOSIS — Z01812 Encounter for preprocedural laboratory examination: Secondary | ICD-10-CM | POA: Diagnosis not present

## 2024-03-26 DIAGNOSIS — Z87891 Personal history of nicotine dependence: Secondary | ICD-10-CM | POA: Diagnosis not present

## 2024-03-26 DIAGNOSIS — N401 Enlarged prostate with lower urinary tract symptoms: Secondary | ICD-10-CM | POA: Diagnosis not present

## 2024-03-26 DIAGNOSIS — F1011 Alcohol abuse, in remission: Secondary | ICD-10-CM | POA: Insufficient documentation

## 2024-03-26 HISTORY — DX: Cerebral infarction, unspecified: I63.9

## 2024-03-26 LAB — COMPREHENSIVE METABOLIC PANEL WITH GFR
ALT: 20 U/L (ref 0–44)
AST: 25 U/L (ref 15–41)
Albumin: 4.3 g/dL (ref 3.5–5.0)
Alkaline Phosphatase: 44 U/L (ref 38–126)
Anion gap: 10 (ref 5–15)
BUN: 14 mg/dL (ref 8–23)
CO2: 25 mmol/L (ref 22–32)
Calcium: 9.7 mg/dL (ref 8.9–10.3)
Chloride: 98 mmol/L (ref 98–111)
Creatinine, Ser: 0.77 mg/dL (ref 0.61–1.24)
GFR, Estimated: >60 mL/min
Glucose, Bld: 110 mg/dL — ABNORMAL HIGH (ref 70–99)
Potassium: 3.9 mmol/L (ref 3.5–5.1)
Sodium: 133 mmol/L — ABNORMAL LOW (ref 135–145)
Total Bilirubin: 0.7 mg/dL (ref 0.0–1.2)
Total Protein: 6.8 g/dL (ref 6.5–8.1)

## 2024-03-26 LAB — CBC
HCT: 45.1 % (ref 39.0–52.0)
Hemoglobin: 14.6 g/dL (ref 13.0–17.0)
MCH: 30.7 pg (ref 26.0–34.0)
MCHC: 32.4 g/dL (ref 30.0–36.0)
MCV: 94.9 fL (ref 80.0–100.0)
Platelets: 225 10*3/uL (ref 150–400)
RBC: 4.75 MIL/uL (ref 4.22–5.81)
RDW: 13.8 % (ref 11.5–15.5)
WBC: 12.3 10*3/uL — ABNORMAL HIGH (ref 4.0–10.5)
nRBC: 0 % (ref 0.0–0.2)

## 2024-03-26 LAB — PROTIME-INR
INR: 1.1 (ref 0.8–1.2)
Prothrombin Time: 14.7 s (ref 11.4–15.2)

## 2024-03-26 NOTE — Progress Notes (Signed)
 PERIOPERATIVE PRESCRIPTION FOR IMPLANTED CARDIAC DEVICE PROGRAMMING  Patient Information: Name:  Roger Benjamin  DOB:  31-Mar-1931  MRN:  [id number]  Planned Procedure:  Transurethral resection of the prostate  Surgeon:  Dr. Shane  Date of Procedure:  04/02/24  Cautery will be used.  Position during surgery:  unknown   Please send documentation back to:  [facility]ry[facility] Law (Fax # [phone])  Device Information:  Clinic EP Physician:  Dr. Eulas Furbish  Device Type:  Pacemaker Manufacturer and Phone #:  Biotronik: [phone] Pacemaker Dependent?:  Yes.   Date of Last Device Check:  02/28/24 Remote Check  Normal Device Function?:  Yes.    Electrophysiologist's Recommendations:  Have magnet available. Provide continuous ECG monitoring when magnet is used or reprogramming is to be performed.  Procedure should not interfere with device function.  No device programming or magnet placement needed.  Per Device Clinic Standing Orders, Prentice JINNY Silvan, RN  12:31 PM 03/26/2024

## 2024-03-27 NOTE — Anesthesia Preprocedure Evaluation (Signed)
 Anesthesia Evaluation    Airway        Dental   Pulmonary former smoker          Cardiovascular hypertension,      Neuro/Psych    GI/Hepatic   Endo/Other    Renal/GU      Musculoskeletal   Abdominal   Peds  Hematology   Anesthesia Other Findings   Reproductive/Obstetrics                              Anesthesia Physical Anesthesia Plan  ASA:   Anesthesia Plan:    Post-op Pain Management:    Induction:   PONV Risk Score and Plan:   Airway Management Planned:   Additional Equipment:   Intra-op Plan:   Post-operative Plan:   Informed Consent:   Plan Discussed with:   Anesthesia Plan Comments: (See PAT note from 12/9)         Anesthesia Quick Evaluation

## 2024-03-27 NOTE — Progress Notes (Signed)
 " Case: [id number] Date/Time: 04/02/24 1245   Procedure: TURP (TRANSURETHRAL RESECTION OF PROSTATE)   Anesthesia type: General   Diagnosis: Enlarged prostate with urinary obstruction [N40.1, N13.8]   Pre-op  diagnosis: Enlarged prostate with urinary obstruction   Location: [facility]   Surgeons: Shane Steffan BROCKS, MD        DISCUSSION: Roger Benjamin is a 17 male with PMH of former smoking, CHB s/p PPM (03/2023), carotid artery disease s/p L CEA with patch angioplasty (08/2019), vertebral artery disease, hx of CVA (11/2022), COPD, OSA (no CPAP), hx of ETOH abuse, BPH.  Patient followed by Cardiology for hx of CHB s/p PPM in 03/2023. Last seen by EP on 07/30/2023. Patient reportedly doing well. He had some alerts from pacemaker checks about episodes of NSVT. Per EP PA [place] patient asymptomatic and recent Echo with normal LVEF so no further w/u pursued. Patient had a cardiac clearance tele visit on 03/13/24 and was cleared for surgery:  Preoperative Cardiovascular Risk Assessment: According to the Revised Cardiac Risk Index (RCRI), his Perioperative Risk of Major Cardiac Event is (%): 0.9. His Functional Capacity in METs is: 5.07 according to the Duke Activity Status Index (DASI). Therefore, based on ACC/AHA guidelines, patient would be at acceptable risk for the planned procedure without further cardiovascular testing. I will route this recommendation to the requesting party via Epic fax function. The patient was advised that if he develops new symptoms prior to surgery to contact our office to arrange for a follow-up visit, and he verbalized understanding. Patient takes Plavix  for history of CVA. This is not managed by cardiology. Recommendations for holding Plavix  prior to procedure should come from managing provider (PCP).  Patient follows with Vascular for CAS and vertebral artery disease. Last seen by Dr. Lanis on 03/26/24. He was advised he could stop his Plavix  for upcoming  procedure.   LD Plavix : 2/1  Device orders:  Device Information:   Clinic EP Physician:  Dr. Eulas Furbish   Device Type:  Pacemaker Manufacturer and Phone #:  [employer]ronik: [phone] Pacemaker Dependent?:  Yes.   Date of Last Device Check:  02/28/24 Remote Check            Normal Device Function?:  Yes.     Electrophysiologist's Recommendations:   Have magnet available. Provide continuous ECG monitoring when magnet is used or reprogramming is to be performed.  Procedure should not interfere with device function.  No device programming or magnet placement needed.  VS: BP 114/73   Pulse 68   Temp (!) 36.4 C (Oral)   Resp 18   Ht 6' 1 (1.854 m)   Wt 80.7 kg   SpO2 98%   BMI 23.48 kg/m   PROVIDERS: Valentin Skates, DO   LABS: Labs reviewed: Acceptable for surgery. (all labs ordered are listed, but only abnormal results are displayed)  Labs Reviewed  COMPREHENSIVE METABOLIC PANEL WITH GFR - Abnormal; Notable for the following components:      Result Value   Sodium 133 (*)    Glucose, Bld 110 (*)    All other components within normal limits  CBC - Abnormal; Notable for the following components:   WBC 12.3 (*)    All other components within normal limits  PROTIME-INR    Device check 02/28/24:  Remote pacemaker interrogation. Presenting Rhythm:A-V dual paced. Battery and lead parameters stable with stable capture and sensing. Device programming is appropriate.    No clinically significant arrhythmia noted. Continue remote monitoring  Echo 12/07/22:  IMPRESSIONS    1. Left ventricular ejection fraction, by estimation, is >75%. The left ventricle has hyperdynamic function. The left ventricle has no regional wall motion abnormalities. Left ventricular diastolic parameters were normal.  2. Right ventricular systolic function is normal. The right ventricular size is normal.  3. The mitral valve is normal in structure. No evidence of mitral  valve regurgitation. No evidence of mitral stenosis.  4. The aortic valve is tricuspid. Aortic valve regurgitation is not visualized. Aortic valve sclerosis is present, with no evidence of aortic valve stenosis.  5. The inferior vena cava is normal in size with greater than 50% respiratory variability, suggesting right atrial pressure of 3 mmHg.  Comparison(s): A prior study was performed on 10/16/2022. LVEF is now hyperdynamic, Grade 1 diastolic dysfunction has resolved, otherwise no significant change.  Conclusion(s)/Recommendation(s): No intracardiac source of embolism detected on this transthoracic study. Consider a transesophageal echocardiogram to exclude cardiac source of embolism if clinically indicated. No left ventricular mural or apical thrombus/thrombi.  Past Medical History:  Diagnosis Date   Atrioventricular block    1st degree   Carotid artery stenosis    Colon polyposis    Emphysema lung (HCC)    incidental finding on CT scan 01/2021   HLD (hyperlipidemia)    Hypertension    Unsure is on Metoprolol    Psoriasis    Skin cancer    Stroke [facility])     Past Surgical History:  Procedure Laterality Date   ENDARTERECTOMY Left 09/23/2019   Procedure: ENDARTERECTOMY LEFT CAROTID with PATCH ANGIOPLASTY;  Surgeon: Oris Krystal FALCON, MD;  Location: [facility] OR;  Service: Vascular;  Laterality: Left;   FRACTURE SURGERY Left    leg   PACEMAKER IMPLANT N/A 04/18/2023   Procedure: PACEMAKER IMPLANT;  Surgeon: Cindie Ole DASEN, MD;  Location: [facility];  Service: Cardiovascular;  Laterality: N/A;   SKIN CANCER EXCISION     TONSILLECTOMY     as a child    MEDICATIONS:  Cholecalciferol (VITAMIN D -3 PO)   clopidogrel  (PLAVIX ) 75 MG tablet   metoprolol  succinate (TOPROL  XL) 25 MG 24 hr tablet   Polyethyl Glycol-Propyl Glycol (SYSTANE) 0.4-0.3 % SOLN   rosuvastatin  (CRESTOR ) 20 MG tablet   Vibegron (GEMTESA) 75 MG TABS   No current facility-administered medications for  this encounter.   Burnard CHRISTELLA Odis DEVONNA [facility] Short Stay/Anesthesiology [facility] Phone [phone] 03/27/2024 3:01 PM        "

## 2024-04-02 ENCOUNTER — Encounter (HOSPITAL_COMMUNITY): Payer: Self-pay | Admitting: Urology

## 2024-04-02 ENCOUNTER — Ambulatory Visit (HOSPITAL_COMMUNITY)
Admission: RE | Admit: 2024-04-02 | Discharge: 2024-04-03 | Disposition: A | Source: Ambulatory Visit | Attending: Urology | Admitting: Urology

## 2024-04-02 ENCOUNTER — Encounter (HOSPITAL_COMMUNITY): Admission: RE | Payer: Self-pay | Source: Ambulatory Visit

## 2024-04-02 ENCOUNTER — Other Ambulatory Visit: Payer: Self-pay

## 2024-04-02 ENCOUNTER — Ambulatory Visit (HOSPITAL_COMMUNITY)

## 2024-04-02 ENCOUNTER — Encounter (HOSPITAL_COMMUNITY): Payer: Self-pay | Admitting: Medical

## 2024-04-02 DIAGNOSIS — N400 Enlarged prostate without lower urinary tract symptoms: Secondary | ICD-10-CM | POA: Diagnosis present

## 2024-04-02 MED ORDER — OXYCODONE HCL 5 MG PO TABS: 5.0000 mg | ORAL_TABLET | Freq: Once | ORAL | Status: DC | PRN

## 2024-04-02 MED ORDER — PHENYLEPHRINE 80 MCG/ML (10ML) SYRINGE FOR IV PUSH (FOR BLOOD PRESSURE SUPPORT)
PREFILLED_SYRINGE | INTRAVENOUS | Status: DC | PRN
  Administered 2024-04-02 (×2): 80 ug via INTRAVENOUS
  Administered 2024-04-02: 160 ug via INTRAVENOUS
  Administered 2024-04-02 (×5): 80 ug via INTRAVENOUS
  Administered 2024-04-02: 160 ug via INTRAVENOUS
  Administered 2024-04-02: 80 ug via INTRAVENOUS

## 2024-04-02 MED ORDER — POLYVINYL ALCOHOL 1.4 % OP SOLN: 1.0000 [drp] | Freq: Three times a day (TID) | OPHTHALMIC | Status: DC | PRN

## 2024-04-02 MED ORDER — TRANEXAMIC ACID-NACL 1000-0.7 MG/100ML-% IV SOLN
1000.0000 mg | INTRAVENOUS | Status: DC
  Filled 2024-04-02: qty 100

## 2024-04-02 MED ORDER — METOPROLOL SUCCINATE ER 25 MG PO TB24
25.0000 mg | ORAL_TABLET | Freq: Every day | ORAL | Status: DC
  Administered 2024-04-03: 25 mg via ORAL
  Filled 2024-04-02: qty 1

## 2024-04-02 MED ORDER — SODIUM CHLORIDE 0.9 % IR SOLN
3000.0000 mL | Status: DC
  Administered 2024-04-02 – 2024-04-03 (×6): 3000 mL

## 2024-04-02 MED ORDER — ORAL CARE MOUTH RINSE: 15.0000 mL | Freq: Once | OROMUCOSAL | Status: AC

## 2024-04-02 MED ORDER — ACETAMINOPHEN 10 MG/ML IV SOLN: 1000.0000 mg | Freq: Once | INTRAVENOUS | Status: DC | PRN

## 2024-04-02 MED ORDER — DEXAMETHASONE SOD PHOSPHATE PF 10 MG/ML IJ SOLN
INTRAMUSCULAR | Status: DC | PRN
  Administered 2024-04-02: 10 mg via INTRAVENOUS

## 2024-04-02 MED ORDER — LACTATED RINGERS IV SOLN: INTRAVENOUS | Status: DC

## 2024-04-02 MED ORDER — ONDANSETRON HCL 4 MG/2ML IJ SOLN
INTRAMUSCULAR | Status: DC | PRN
  Administered 2024-04-02: 4 mg via INTRAVENOUS

## 2024-04-02 MED ORDER — CHLORHEXIDINE GLUCONATE 0.12 % MT SOLN
15.0000 mL | Freq: Once | OROMUCOSAL | Status: AC
  Administered 2024-04-02: 15 mL via OROMUCOSAL

## 2024-04-02 MED ORDER — ONDANSETRON HCL 4 MG/2ML IJ SOLN: 4.0000 mg | Freq: Once | INTRAMUSCULAR | Status: DC | PRN

## 2024-04-02 MED ORDER — AMISULPRIDE (ANTIEMETIC) 5 MG/2ML IV SOLN: 10.0000 mg | Freq: Once | INTRAVENOUS | Status: DC | PRN

## 2024-04-02 MED ORDER — OXYCODONE HCL 5 MG PO TABS: 5.0000 mg | ORAL_TABLET | ORAL | Status: DC | PRN

## 2024-04-02 MED ORDER — PROPOFOL 500 MG/50ML IV EMUL
INTRAVENOUS | Status: DC | PRN
  Administered 2024-04-02: 150 ug/kg/min via INTRAVENOUS

## 2024-04-02 MED ORDER — SODIUM CHLORIDE 0.9 % IV SOLN: INTRAVENOUS | Status: DC

## 2024-04-02 MED ORDER — DIPHENHYDRAMINE HCL 50 MG/ML IJ SOLN: 12.5000 mg | Freq: Four times a day (QID) | INTRAMUSCULAR | Status: DC | PRN

## 2024-04-02 MED ORDER — DEXMEDETOMIDINE HCL IN NACL 80 MCG/20ML IV SOLN
INTRAVENOUS | Status: DC | PRN
  Administered 2024-04-02 (×3): 4 ug via INTRAVENOUS

## 2024-04-02 MED ORDER — OXYCODONE HCL 5 MG/5ML PO SOLN: 5.0000 mg | Freq: Once | ORAL | Status: DC | PRN

## 2024-04-02 MED ORDER — METHOCARBAMOL 500 MG PO TABS
1000.0000 mg | ORAL_TABLET | Freq: Four times a day (QID) | ORAL | Status: DC
  Administered 2024-04-02 – 2024-04-03 (×3): 1000 mg via ORAL
  Filled 2024-04-02 (×3): qty 2

## 2024-04-02 MED ORDER — PROPOFOL 10 MG/ML IV BOLUS
INTRAVENOUS | Status: DC | PRN
  Administered 2024-04-02: 50 mg via INTRAVENOUS
  Administered 2024-04-02: 100 mg via INTRAVENOUS
  Administered 2024-04-02: 50 mg via INTRAVENOUS

## 2024-04-02 MED ORDER — ACETAMINOPHEN 325 MG PO TABS: 650.0000 mg | ORAL_TABLET | ORAL | Status: DC | PRN

## 2024-04-02 MED ORDER — CEFAZOLIN SODIUM-DEXTROSE 2-4 GM/100ML-% IV SOLN
2.0000 g | INTRAVENOUS | Status: AC
  Administered 2024-04-02: 2 g via INTRAVENOUS
  Filled 2024-04-02: qty 100

## 2024-04-02 MED ORDER — DIPHENHYDRAMINE HCL 12.5 MG/5ML PO ELIX: 12.5000 mg | ORAL_SOLUTION | Freq: Four times a day (QID) | ORAL | Status: DC | PRN

## 2024-04-02 MED ORDER — FENTANYL CITRATE (PF) 100 MCG/2ML IJ SOLN
INTRAMUSCULAR | Status: AC
  Filled 2024-04-02: qty 2

## 2024-04-02 MED ORDER — FENTANYL CITRATE (PF) 100 MCG/2ML IJ SOLN
INTRAMUSCULAR | Status: DC | PRN
  Administered 2024-04-02: 50 ug via INTRAVENOUS
  Administered 2024-04-02 (×2): 25 ug via INTRAVENOUS

## 2024-04-02 MED ORDER — PHENYLEPHRINE 80 MCG/ML (10ML) SYRINGE FOR IV PUSH (FOR BLOOD PRESSURE SUPPORT)
PREFILLED_SYRINGE | INTRAVENOUS | Status: AC
  Filled 2024-04-02: qty 30

## 2024-04-02 MED ORDER — ROSUVASTATIN CALCIUM 20 MG PO TABS
20.0000 mg | ORAL_TABLET | Freq: Every day | ORAL | Status: DC
  Administered 2024-04-03: 20 mg via ORAL
  Filled 2024-04-02: qty 1

## 2024-04-02 MED ORDER — SODIUM CHLORIDE 0.9 % IR SOLN
Status: DC | PRN
  Administered 2024-04-02 (×7): 3000 mL

## 2024-04-02 MED ORDER — FENTANYL CITRATE (PF) 50 MCG/ML IJ SOSY: 25.0000 ug | PREFILLED_SYRINGE | INTRAMUSCULAR | Status: DC | PRN

## 2024-04-02 MED ORDER — ACETAMINOPHEN 500 MG PO TABS
1000.0000 mg | ORAL_TABLET | Freq: Four times a day (QID) | ORAL | Status: DC
  Administered 2024-04-02 – 2024-04-03 (×3): 1000 mg via ORAL
  Filled 2024-04-02 (×3): qty 2

## 2024-04-02 MED ORDER — POLYETHYLENE GLYCOL 3350 17 G PO PACK: 17.0000 g | PACK | Freq: Every day | ORAL | 0 refills | Status: AC

## 2024-04-02 MED ORDER — VITAMIN D-3 25 MCG (1000 UT) PO CAPS: ORAL_CAPSULE | Freq: Every morning | ORAL | Status: DC

## 2024-04-02 MED ORDER — METHOCARBAMOL 750 MG PO TABS: 750.0000 mg | ORAL_TABLET | Freq: Four times a day (QID) | ORAL | 0 refills | Status: AC

## 2024-04-02 MED ORDER — POLYETHYL GLYCOL-PROPYL GLYCOL 0.4-0.3 % OP SOLN: 1.0000 [drp] | Freq: Three times a day (TID) | OPHTHALMIC | Status: DC | PRN

## 2024-04-02 MED ORDER — PROPOFOL 500 MG/50ML IV EMUL
INTRAVENOUS | Status: AC
  Filled 2024-04-02: qty 50

## 2024-04-02 MED ORDER — SENNOSIDES-DOCUSATE SODIUM 8.6-50 MG PO TABS
2.0000 | ORAL_TABLET | Freq: Every day | ORAL | Status: DC
  Administered 2024-04-02: 2 via ORAL
  Filled 2024-04-02: qty 2

## 2024-04-02 MED ORDER — LIDOCAINE HCL (PF) 2 % IJ SOLN
INTRAMUSCULAR | Status: DC | PRN
  Administered 2024-04-02: 100 mg via INTRADERMAL

## 2024-04-02 MED ORDER — SIMETHICONE 80 MG PO CHEW: 80.0000 mg | CHEWABLE_TABLET | Freq: Four times a day (QID) | ORAL | Status: DC | PRN

## 2024-04-02 MED ORDER — FINASTERIDE 5 MG PO TABS: 5.0000 mg | ORAL_TABLET | Freq: Every day | ORAL | 2 refills | Status: AC

## 2024-04-02 MED ORDER — POLYETHYLENE GLYCOL 3350 17 G PO PACK: 17.0000 g | PACK | Freq: Every day | ORAL | Status: DC | PRN

## 2024-04-02 MED ORDER — ONDANSETRON HCL 4 MG/2ML IJ SOLN: 4.0000 mg | INTRAMUSCULAR | Status: DC | PRN

## 2024-04-02 MED ORDER — PHENYLEPHRINE 80 MCG/ML (10ML) SYRINGE FOR IV PUSH (FOR BLOOD PRESSURE SUPPORT)
PREFILLED_SYRINGE | INTRAVENOUS | Status: AC
  Filled 2024-04-02: qty 10

## 2024-04-02 MED ORDER — PROPOFOL 1000 MG/100ML IV EMUL
INTRAVENOUS | Status: AC
  Filled 2024-04-02: qty 100

## 2024-04-02 NOTE — Discharge Instructions (Addendum)
 Transurethral Resection of the Prostate (TURP)   Care After  Refer to this sheet in the next few weeks. These discharge instructions provide you with general information on caring for yourself after you leave the hospital. Your caregiver may also give you specific instructions. Your treatment has been planned according to the most current medical practices available, but unavoidable complications sometimes occur. If you have any problems or questions after discharge, please call your caregiver.  HOME CARE INSTRUCTIONS   Medications You may receive medicine for pain management. As your level of discomfort decreases, adjustments in your pain medicines may be made.  Take all medicines as directed.  You may be given a medicine (antibiotic) to kill germs following surgery. Finish all medicines. Let your caregiver know if you have any side effects or problems from the medicine.  If you are on aspirin , it would be best not to restart the aspirin  until the blood in the urine clears Plavix  can be restarted on Monday 04/06/24 Hygiene You can take a shower after surgery.  You should not take a bath while you still have the urethral catheter. Activity You will be encouraged to get out of bed as much as possible and increase your activity level as tolerated.  Spend the first week in and around your home. For 3 weeks, avoid the following:  Straining.  Running.  Strenuous work.  Walks longer than a few blocks.  Riding for extended periods.  Sexual relations.  Do not lift heavy objects (more than 10 pounds) for at least 1 month. When lifting, use your arms instead of your abdominal muscles.  You will be encouraged to walk as tolerated. Do not exert yourself. Increase your activity level slowly. Remember that it is important to keep moving after an operation of any type. This cuts down on the possibility of developing blood clots.  Your caregiver will tell you when you can resume driving and light  housework. Discuss this at your first office visit after discharge. Diet No special diet is ordered after a TURP. However, if you are on a special diet for another medical problem, it should be continued.  Normal fluid intake is usually recommended.  Avoid alcohol  and caffeinated drinks for 2 weeks. They irritate the bladder. Decaffeinated drinks are okay.  Avoid spicy foods.  Bladder Function For the first 3 weeks, empty the bladder whenever you feel a definite desire. Do not try to hold the urine for long periods of time. Urgency with urination can be normal after this surgery. Urinating once or twice a night even after you are healed is not uncommon.  You may see some recurrence of blood in the urine after discharge from the hospital. This usually happens within 2 weeks after the procedure.If this occurs, force fluids again as you did in the hospital and reduce your activity.  Bowel Function You may experience some constipation after surgery. This can be minimized by increasing fluids and fiber in your diet. Drink enough water and fluids to keep your urine clear or pale yellow.  A stool softener may be prescribed for use at home. Do not strain to move your bowels.  If you are requiring increased pain medicine, it is important that you take stool softeners to prevent constipation. This will help to promote proper healing by reducing the need to strain to move your bowels.  Sexual Activity Semen movement in the opposite direction and into the bladder (retrograde ejaculation) may occur. Since the semen passes into the bladder,  cloudy urine can occur the first time you urinate after intercourse. Or, you may not have an ejaculation during erection. Ask your caregiver when you can resume sexual activity. Retrograde ejaculation and reduced semen discharge should not reduce one's pleasure of intercourse.  Postoperative Visit Arrange the date and time of your after surgery visit with your caregiver.   Return to Work After your recovery is complete, you will be able to return to work and resume all activities. Your caregiver will inform you when you can return to work.    Foley Catheter Care A soft, flexible tube (Foley catheter) may have been placed in your bladder to drain urine and fluid. Follow these instructions: Taking Care of the Catheter Keep the area where the catheter leaves your body clean.  Attach the catheter to the leg so there is no tension on the catheter.  Keep the drainage bag below the level of the bladder, but keep it OFF the floor.  Do not take long soaking baths. Your caregiver will give instructions about showering.  Wash your hands before touching ANYTHING related to the catheter or bag.  Using mild soap and warm water on a washcloth:  Clean the area closest to the catheter insertion site using a circular motion around the catheter.  Clean the catheter itself by wiping AWAY from the insertion site for several inches down the tube.  NEVER wipe upward as this could sweep bacteria up into the urethra (tube in your body that normally drains the bladder) and cause infection.  Place a small amount of sterile lubricant at the tip of the penis where the catheter is entering.  Taking Care of the Drainage Bags Two drainage bags may be taken home: a large overnight drainage bag, and a smaller leg bag which fits underneath clothing.  It is okay to wear the overnight bag at any time, but NEVER wear the smaller leg bag at night.  Keep the drainage bag well below the level of your bladder. This prevents backflow of urine into the bladder and allows the urine to drain freely.  Anchor the tubing to your leg to prevent pulling or tension on the catheter. Use tape or a leg strap provided by the hospital.  Empty the drainage bag when it is 1/2 to 3/4 full. Wash your hands before and after touching the bag.  Periodically check the tubing for kinks to make sure there is no pressure on  the tubing which could restrict the flow of urine.  Changing the Drainage Bags Cleanse both ends of the clean bag with alcohol  before changing.  Pinch off the rubber catheter to avoid urine spillage during the disconnection.  Disconnect the dirty bag and connect the clean one.  Empty the dirty bag carefully to avoid a urine spill.  Attach the new bag to the leg with tape or a leg strap.  Cleaning the Drainage Bags Whenever a drainage bag is disconnected, it must be cleaned quickly so it is ready for the next use.  Wash the bag in warm, soapy water.  Rinse the bag thoroughly with warm water.  Soak the bag for 30 minutes in a solution of white vinegar and water (1 cup vinegar to 1 quart warm water).  Rinse with warm water.  SEEK MEDICAL CARE IF:  You have chills or night sweats.  You are leaking around your catheter or have problems with your catheter. It is not uncommon to have sporadic leakage around your catheter as a result of  bladder spasms. If the leakage stops, there is not much need for concern. If you are uncertain, call your caregiver.  You develop side effects that you think are coming from your medicines.  SEEK IMMEDIATE MEDICAL CARE IF:  You are suddenly unable to urinate. Check to see if there are any kinks in the drainage tubing that may cause this. If you cannot find any kinks, call your caregiver immediately. This is an emergency.  You develop shortness of breath or chest pains.  Bleeding persists or clots develop in your urine.  You have a fever.  You develop pain in your back or over your lower belly (abdomen).  You develop pain or swelling in your legs.  Any problems you are having get worse rather than better.  MAKE SURE YOU:  Understand these instructions.  Will watch your condition.  Will get help right away if you are not doing well or get worse.     Foley Catheter Care A soft, flexible tube (Foley catheter) may have been placed in your bladder to drain urine and  fluid. Follow these instructions: Taking Care of the Catheter Keep the area where the catheter leaves your body clean.  Attach the catheter to the leg so there is no tension on the catheter.  Keep the drainage bag below the level of the bladder, but keep it OFF the floor.  Do not take long soaking baths. Your caregiver will give instructions about showering.  Wash your hands before touching ANYTHING related to the catheter or bag.  Using mild soap and warm water on a washcloth:  Clean the area closest to the catheter insertion site using a circular motion around the catheter.  Clean the catheter itself by wiping AWAY from the insertion site for several inches down the tube.  NEVER wipe upward as this could sweep bacteria up into the urethra (tube in your body that normally drains the bladder) and cause infection.  Place a small amount of sterile lubricant at the tip of the penis where the catheter is entering.  Taking Care of the Drainage Bags Two drainage bags may be taken home: a large overnight drainage bag, and a smaller leg bag which fits underneath clothing.  It is okay to wear the overnight bag at any time, but NEVER wear the smaller leg bag at night.  Keep the drainage bag well below the level of your bladder. This prevents backflow of urine into the bladder and allows the urine to drain freely.  Anchor the tubing to your leg to prevent pulling or tension on the catheter. Use tape or a leg strap provided by the hospital.  Empty the drainage bag when it is 1/2 to 3/4 full. Wash your hands before and after touching the bag.  Periodically check the tubing for kinks to make sure there is no pressure on the tubing which could restrict the flow of urine.  Changing the Drainage Bags Cleanse both ends of the clean bag with alcohol  before changing.  Pinch off the rubber catheter to avoid urine spillage during the disconnection.  Disconnect the dirty bag and connect the clean one.  Empty the  dirty bag carefully to avoid a urine spill.  Attach the new bag to the leg with tape or a leg strap.  Cleaning the Drainage Bags Whenever a drainage bag is disconnected, it must be cleaned quickly so it is ready for the next use.  Wash the bag in warm, soapy water.  Rinse the bag thoroughly  with warm water.  Soak the bag for 30 minutes in a solution of white vinegar and water (1 cup vinegar to 1 quart warm water).  Rinse with warm water.   IT Normal To See Some blood in the urine is normal, as long as you can see your fingers through the catheter tubing (not the bag) this is ok. As long as the urine is not the consistency of tomato paste.  It will be normal to feel the urge to urinate often this is a side effect of the catheter  Some discharge around the catheter where it exits the penis is normal  SEEK MEDICAL CARE IF:  You have chills or night sweats.  You are leaking around your catheter or have problems with your catheter. It is not uncommon to have sporadic leakage around your catheter as a result of bladder spasms. If the leakage stops, there is not much need for concern. If you are uncertain, call your caregiver.  You develop side effects that you think are coming from your medicines.  SEEK IMMEDIATE MEDICAL CARE IF:  You are suddenly unable to urinate. Check to see if there are any kinks in the drainage tubing that may cause this. If you cannot find any kinks, call your caregiver immediately. This is an emergency.  You develop shortness of breath or chest pains.  Bleeding persists or clots develop in your urine.  You have a fever.  You develop pain in your back or over your lower belly (abdomen).  You develop pain or swelling in your legs.  Any problems you are having get worse rather than better.  MAKE SURE YOU:  Understand these instructions.  Will watch your condition.  Will get help right away if you are not doing well or get worse.

## 2024-04-02 NOTE — Op Note (Signed)
 Preoperative diagnosis:  Bladder outlet obstruction Urinary retention  Postoperative diagnosis:  same   Procedure:  Transurethral resection of prostate Urethral Dilation   Surgeon: Steffan Pea MD  Anesthesia: General   Complications: None   Intraoperative findings: Cystoscopy demonstrated slight bulbar urethral stricture that required dilation Large prostate Prostate resected to create appropriate channel for urination.  EBL: 100cc   Specimens: Prostate chips  Indication: Roger Benjamin is a 89 y.o. patient with bladder outlet obstruction/urinary retention/obstructive voiding symptoms.  After reviewing the management options for treatment, he elected to proceed with the above surgical procedure(s). We have discussed the potential benefits and risks of the procedure, side effects of the proposed treatment, the likelihood of the patient achieving the goals of the procedure, and any potential problems that might occur during the procedure or recuperation. Informed consent has been obtained.  Description of procedure:  The patient was taken to the operating room and general anesthesia was induced. The patient was placed in the dorsal lithotomy position, prepped and draped in the usual sterile fashion, and preoperative antibiotics were administered. A preoperative time-out was performed.   The 26 french resectoscope was attempted to be passed into the bladder.  There was a stricture that was encountered at the bulbar urethra a sensor wire was placed past this using the UroMax dilator and the bulbar urethra was dilated to 24 French.  Then using the wire as a guide the 26 French resectoscope was guided into the bladder. Pan cystoscopy was performed findings as noted above.  An exchange the obturator for the resectoscope itself and the loop cautery. The median lobe was taken down first so that the resection bed was flush with the bladder floor. Attention was then turned to the patient's  left lateral lobe. The was resected to the level of the capsule and distally to the area just proximal to the verumontanum. Attention was transitioned to the Left lateral lobe and was resected in the same fashion as the right. The prostatic floor was then resected to the level of the capsule and distally to just proximal to the verumontanum. The roof of the prostate was then resected.   Once I was satisfied that the prostate had been adequately resected I evacuated the prostate chips using a Toomey syringe. I then reintroduced the resectoscope and ensured adequate hemostasis. I then left the bladder full and removed the resectoscope entirely. Exam under anesthesia demonstrated a normal sized prostate with no nodules.  I then placed a 22 French three-way Foley catheter. I irrigated the catheter gently and removed all debris and clots. I then placed the patient on gentle continuous bladder irrigation.  He subsequently extubated and returned to PACU in excellent condition.  Steffan Pea MD

## 2024-04-02 NOTE — Interval H&P Note (Signed)
 History and Physical Interval Note:  04/02/2024 12:50 PM  Roger Benjamin  has presented today for surgery, with the diagnosis of Enlarged prostate with urinary obstruction.  The various methods of treatment have been discussed with the patient and family. After consideration of risks, benefits and other options for treatment, the patient has consented to  Procedures: TURP (TRANSURETHRAL RESECTION OF PROSTATE) (N/A) as a surgical intervention.  The patient's history has been reviewed, patient examined, no change in status, stable for surgery.  I have reviewed the patient's chart and labs.  Questions were answered to the patient's satisfaction.    Ucx negative, it was revealed to me that pt has significant sundowning after surgery. Will plan to keep on CIWA protocols due alcohol  use.    Steffan JAYSON Pea

## 2024-04-02 NOTE — Transfer of Care (Signed)
 Immediate Anesthesia Transfer of Care Note  Patient: Roger Benjamin  Procedure(s) Performed: TURP (TRANSURETHRAL RESECTION OF PROSTATE)  Patient Location: PACU  Anesthesia Type:General  Level of Consciousness: awake, alert , and oriented  Airway & Oxygen Therapy: Patient Spontanous Breathing and Patient connected to face mask oxygen  Post-op Assessment: Report given to RN and Post -op Vital signs reviewed and stable  Post vital signs: Reviewed and stable  Last Vitals:  Vitals Value Taken Time  BP 131/85 04/02/24 14:42  Temp    Pulse 69 04/02/24 14:44  Resp 14 04/02/24 14:44  SpO2 100 % 04/02/24 14:44  Vitals shown include unfiled device data.  Last Pain:  Vitals:   04/02/24 1123  TempSrc: Oral         Complications: No notable events documented.

## 2024-04-02 NOTE — Anesthesia Procedure Notes (Signed)
 Procedure Name: LMA Insertion Date/Time: 04/02/2024 1:13 PM  Performed by: Jeilani Grupe D, CRNAPre-anesthesia Checklist: Patient identified, Emergency Drugs available, Suction available and Patient being monitored Patient Re-evaluated:Patient Re-evaluated prior to induction Oxygen Delivery Method: Circle system utilized Preoxygenation: Pre-oxygenation with 100% oxygen Induction Type: IV induction Ventilation: Mask ventilation without difficulty LMA: LMA inserted LMA Size: 5.0 Tube type: Oral Number of attempts: 1 Placement Confirmation: positive ETCO2 and breath sounds checked- equal and bilateral Tube secured with: Tape Dental Injury: Teeth and Oropharynx as per pre-operative assessment

## 2024-04-03 ENCOUNTER — Encounter (HOSPITAL_COMMUNITY): Payer: Self-pay | Admitting: Urology

## 2024-04-03 NOTE — Progress Notes (Signed)
 Discharge instructions reviewed with patient and daughter including not starting Plavix  back until Monday 2/9. Demonstrated and instructed on changing from foley bedside drainage bag to leg bag and back.  Reiterated to patient not to pull on foley at any time as this will cause trauma and bleeding.. Patient and daughter express understanding.

## 2024-04-03 NOTE — Discharge Summary (Signed)
 Date of admission: 04/02/2024  Date of discharge: 04/03/2024  Admission diagnosis: BPH  Discharge diagnosis: BPH and urethral stricture   Secondary diagnoses:  Patient Active Problem List   Diagnosis Date Noted   BPH (benign prostatic hyperplasia) 04/02/2024   Acute arterial ischemic stroke, multifocal, mult vascular territories (HCC) 12/07/2022   Left carotid artery stenosis 09/23/2019    Procedures performed: Procedures: TURP (TRANSURETHRAL RESECTION OF PROSTATE)  History and Physical: For full details, please see admission history and physical. Briefly, Roger Benjamin is a 89 y.o. year old patient with BPH and urethral stricter s/p urethral dilation and TURP.   Hospital Course: Patient tolerated the procedure well.  He was then transferred to the floor after an uneventful PACU stay.  His hospital course was uncomplicated.  On POD#1 he had met discharge criteria: was eating a regular diet, was up and ambulating independently,  pain was well controlled, catheter was draining pink urine with no clots off CBI and was ready to for discharge. PT had no abdominal pain.   Physical Exam:  General: NAD Resp: normal WOB on RA Cards: RRR per monitor Abdomen: soft non tender  GU: catheter in place off CBI urine pink with no clots.    Laboratory values:  No results for input(s): WBC, HGB, HCT in the last 72 hours. No results for input(s): NA, K, CL, CO2, GLUCOSE, BUN, CREATININE, CALCIUM  in the last 72 hours. No results for input(s): LABPT, INR in the last 72 hours. No results for input(s): LABURIN in the last 72 hours. Results for orders placed or performed in visit on 03/17/21  SARS Coronavirus 2 (TAT 6-24 hrs)     Status: None   Collection Time: 03/17/21 12:00 AM  Result Value Ref Range Status   SARS Coronavirus 2 RESULT: NEGATIVE  Final    Comment: RESULT: NEGATIVESARS-CoV-2 INTERPRETATION:A NEGATIVE  test result means that SARS-CoV-2 RNA was not present  in the specimen above the limit of detection of this test. This does not preclude a possible SARS-CoV-2 infection and should not be used as the  sole basis for patient management decisions. Negative results must be combined with clinical observations, patient history, and epidemiological information. Optimum specimen types and timing for peak viral levels during infections caused by SARS-CoV-2  have not been determined. Collection of multiple specimens or types of specimens may be necessary to detect virus. Improper specimen collection and handling, sequence variability under primers/probes, or organism present below the limit of detection may  lead to false negative results. Positive and negative predictive values of testing are highly dependent on prevalence. False negative test results are more likely when prevalence of disease is high.The expected result is NEGATIVE.Fact S heet for  Healthcare Providers: [employer] Sheet for Patients: Https://poole-freeman.org/ Reference Range - Negative     Disposition: Home  Discharge instruction: The patient was instructed to be ambulatory but told to refrain from heavy lifting, strenuous activity, or driving.   Discharge medications:  Allergies as of 04/03/2024       Reactions   Silodosin Other (See Comments)   syncope/dizziness   Sulfa  Antibiotics Other (See Comments)   stomach upset   Tape Other (See Comments)   Needs paper tape        Medication List     TAKE these medications    clopidogrel  75 MG tablet Commonly known as: PLAVIX  Take 75 mg by mouth in the morning.   finasteride  5 MG tablet Commonly known as: Proscar  Take 1 tablet (5 mg  total) by mouth daily.   Gemtesa 75 MG Tabs Generic drug: Vibegron Take 75 mg by mouth at bedtime.   methocarbamol  750 MG tablet Commonly known as: ROBAXIN  Take 1 tablet (750 mg total) by mouth 4 (four) times daily for 20 doses.    metoprolol  succinate 25 MG 24 hr tablet Commonly known as: Toprol  XL Take 1 tablet (25 mg total) by mouth daily.   polyethylene glycol 17 g packet Commonly known as: MiraLax  Take 17 g by mouth daily.   rosuvastatin  20 MG tablet Commonly known as: CRESTOR  Take 20 mg by mouth in the morning.   Systane 0.4-0.3 % Soln Generic drug: Polyethyl Glycol-Propyl Glycol Place 1-2 drops into both eyes 3 (three) times daily as needed (dry/irritated eyes.).   VITAMIN D -3 PO Take 1 capsule by mouth in the morning.        Followup:   Follow-up Information     Shane Steffan BROCKS, MD Follow up.   Specialty: Urology Why: You will be called to set up follow up next week for catheter removal Contact information: [address] [place] [address] [phone]

## 2024-06-11 ENCOUNTER — Ambulatory Visit: Admitting: Podiatry
